# Patient Record
Sex: Male | Born: 1945 | Race: White | Hispanic: No | Marital: Married | Smoking: Former smoker
Health system: Southern US, Community
[De-identification: ages and names within clinical notes are randomized; demographics above are authoritative.]

---

## 2015-05-12 DIAGNOSIS — K760 Fatty (change of) liver, not elsewhere classified: Secondary | ICD-10-CM | POA: Diagnosis present

## 2020-11-04 ENCOUNTER — Emergency Department: Payer: 59

## 2020-11-04 ENCOUNTER — Other Ambulatory Visit: Payer: Self-pay

## 2020-11-04 ENCOUNTER — Inpatient Hospital Stay
Admission: EM | Admit: 2020-11-04 | Discharge: 2020-11-09 | DRG: 177 | Disposition: A | Discharge status: Home / Self Care | Payer: 59 | Attending: Internal Medicine | Admitting: Internal Medicine

## 2020-11-04 DIAGNOSIS — K449 Diaphragmatic hernia without obstruction or gangrene: Secondary | ICD-10-CM | POA: Diagnosis present

## 2020-11-04 DIAGNOSIS — J9600 Acute respiratory failure, unspecified whether with hypoxia or hypercapnia: Secondary | ICD-10-CM

## 2020-11-04 DIAGNOSIS — K5900 Constipation, unspecified: Secondary | ICD-10-CM | POA: Diagnosis not present

## 2020-11-04 DIAGNOSIS — J9601 Acute respiratory failure with hypoxia: Secondary | ICD-10-CM | POA: Diagnosis present

## 2020-11-04 DIAGNOSIS — Z79899 Other long term (current) drug therapy: Secondary | ICD-10-CM

## 2020-11-04 DIAGNOSIS — Z6830 Body mass index (BMI) 30.0-30.9, adult: Secondary | ICD-10-CM | POA: Diagnosis not present

## 2020-11-04 DIAGNOSIS — E860 Dehydration: Secondary | ICD-10-CM | POA: Diagnosis present

## 2020-11-04 DIAGNOSIS — J1282 Pneumonia due to coronavirus disease 2019: Secondary | ICD-10-CM | POA: Diagnosis present

## 2020-11-04 DIAGNOSIS — U071 COVID-19: Principal | ICD-10-CM | POA: Diagnosis present

## 2020-11-04 DIAGNOSIS — Z87891 Personal history of nicotine dependence: Secondary | ICD-10-CM | POA: Diagnosis not present

## 2020-11-04 DIAGNOSIS — E785 Hyperlipidemia, unspecified: Secondary | ICD-10-CM | POA: Diagnosis present

## 2020-11-04 DIAGNOSIS — E669 Obesity, unspecified: Secondary | ICD-10-CM | POA: Diagnosis present

## 2020-11-04 DIAGNOSIS — K760 Fatty (change of) liver, not elsewhere classified: Secondary | ICD-10-CM | POA: Diagnosis present

## 2020-11-04 DIAGNOSIS — R0602 Shortness of breath: Secondary | ICD-10-CM | POA: Diagnosis present

## 2020-11-04 DIAGNOSIS — N179 Acute kidney failure, unspecified: Secondary | ICD-10-CM | POA: Diagnosis present

## 2020-11-04 LAB — BASIC METABOLIC PANEL
Anion gap: 11 (ref 5–15)
BUN: 17 mg/dL (ref 8–23)
CO2: 27 mmol/L (ref 22–32)
Calcium: 8.1 mg/dL — ABNORMAL LOW (ref 8.9–10.3)
Chloride: 101 mmol/L (ref 98–111)
Creatinine, Ser: 1.26 mg/dL — ABNORMAL HIGH (ref 0.61–1.24)
GFR, Estimated: >60 mL/min (ref >60)
Glucose, Bld: 113 mg/dL — ABNORMAL HIGH (ref 70–99)
Potassium: 3.9 mmol/L (ref 3.5–5.1)
Sodium: 139 mmol/L (ref 135–145)

## 2020-11-04 LAB — CBC
HCT: 48.7 % (ref 39.0–52.0)
Hemoglobin: 16.1 g/dL (ref 13.0–17.0)
MCH: 30.7 pg (ref 26.0–34.0)
MCHC: 33.1 g/dL (ref 30.0–36.0)
MCV: 92.9 fL (ref 80.0–100.0)
Platelets: 108 10*3/uL — ABNORMAL LOW (ref 150–400)
RBC: 5.24 MIL/uL (ref 4.22–5.81)
RDW: 12.6 % (ref 11.5–15.5)
WBC: 3.8 10*3/uL — ABNORMAL LOW (ref 4.0–10.5)
nRBC: 0 % (ref 0.0–0.2)

## 2020-11-04 LAB — FIBRIN DERIVATIVES D-DIMER (ARMC ONLY): Fibrin derivatives D-dimer (ARMC): 1698.71 ng/mL (FEU) — ABNORMAL HIGH (ref 0.00–499.00)

## 2020-11-04 MED ORDER — ACETAMINOPHEN 325 MG PO TABS: 650.0000 mg | ORAL_TABLET | Freq: Four times a day (QID) | ORAL | Status: DC | PRN

## 2020-11-04 MED ORDER — ZINC SULFATE 220 (50 ZN) MG PO CAPS
220.0000 mg | ORAL_CAPSULE | Freq: Every day | ORAL | Status: DC
  Administered 2020-11-05 – 2020-11-09 (×5): 220 mg via ORAL
  Filled 2020-11-04 (×5): qty 1

## 2020-11-04 MED ORDER — ONDANSETRON HCL 4 MG/2ML IJ SOLN: 4.0000 mg | Freq: Four times a day (QID) | INTRAMUSCULAR | Status: DC | PRN

## 2020-11-04 MED ORDER — SODIUM CHLORIDE 0.9 % IV SOLN
100.0000 mg | Freq: Every day | INTRAVENOUS | Status: AC
  Administered 2020-11-05 – 2020-11-08 (×4): 100 mg via INTRAVENOUS
  Filled 2020-11-04 (×4): qty 20

## 2020-11-04 MED ORDER — SODIUM CHLORIDE 0.9 % IV SOLN
200.0000 mg | Freq: Once | INTRAVENOUS | Status: AC
  Administered 2020-11-04: 20:00:00 200 mg via INTRAVENOUS
  Filled 2020-11-04: qty 200

## 2020-11-04 MED ORDER — ASCORBIC ACID 500 MG PO TABS
500.0000 mg | ORAL_TABLET | Freq: Every day | ORAL | Status: DC
  Administered 2020-11-05 – 2020-11-09 (×5): 500 mg via ORAL
  Filled 2020-11-04 (×5): qty 1

## 2020-11-04 MED ORDER — ENOXAPARIN SODIUM 60 MG/0.6ML ~~LOC~~ SOLN
60.0000 mg | SUBCUTANEOUS | Status: DC
  Administered 2020-11-05 – 2020-11-07 (×3): 60 mg via SUBCUTANEOUS
  Filled 2020-11-04 (×6): qty 0.6

## 2020-11-04 MED ORDER — DEXAMETHASONE SODIUM PHOSPHATE 10 MG/ML IJ SOLN
10.0000 mg | Freq: Once | INTRAMUSCULAR | Status: AC
  Administered 2020-11-04: 19:00:00 10 mg via INTRAVENOUS
  Filled 2020-11-04: qty 1

## 2020-11-04 MED ORDER — DEXAMETHASONE SODIUM PHOSPHATE 10 MG/ML IJ SOLN
6.0000 mg | INTRAMUSCULAR | Status: DC
Start: 2020-11-04 — End: 2020-11-08
  Administered 2020-11-05 – 2020-11-07 (×3): 6 mg via INTRAVENOUS
  Filled 2020-11-04 (×4): qty 1

## 2020-11-04 MED ORDER — ACETAMINOPHEN 325 MG PO TABS
ORAL_TABLET | ORAL | Status: AC
  Administered 2020-11-04: 20:00:00 650 mg via ORAL
  Filled 2020-11-04: qty 2

## 2020-11-04 MED ORDER — HYDROCOD POLST-CPM POLST ER 10-8 MG/5ML PO SUER
5.0000 mL | Freq: Two times a day (BID) | ORAL | Status: DC | PRN
  Administered 2020-11-05 – 2020-11-08 (×3): 5 mL via ORAL
  Filled 2020-11-04 (×3): qty 5

## 2020-11-04 MED ORDER — SODIUM CHLORIDE 0.9 % IV SOLN: 200.0000 mg | Freq: Once | INTRAVENOUS | Status: DC

## 2020-11-04 MED ORDER — GUAIFENESIN-DM 100-10 MG/5ML PO SYRP
10.0000 mL | ORAL_SOLUTION | ORAL | Status: DC | PRN
  Administered 2020-11-07 – 2020-11-08 (×2): 10 mL via ORAL
  Filled 2020-11-04 (×2): qty 10

## 2020-11-04 MED ORDER — ALBUTEROL SULFATE HFA 108 (90 BASE) MCG/ACT IN AERS
2.0000 | INHALATION_SPRAY | Freq: Four times a day (QID) | RESPIRATORY_TRACT | Status: DC
  Administered 2020-11-05 – 2020-11-09 (×17): 2 via RESPIRATORY_TRACT
  Filled 2020-11-04: qty 6.7

## 2020-11-04 MED ORDER — ADULT MULTIVITAMIN W/MINERALS CH
1.0000 | ORAL_TABLET | Freq: Every day | ORAL | Status: DC
  Administered 2020-11-05 – 2020-11-09 (×5): 1 via ORAL
  Filled 2020-11-04 (×5): qty 1

## 2020-11-04 MED ORDER — SODIUM CHLORIDE 0.9 % IV SOLN: 100.0000 mg | Freq: Every day | INTRAVENOUS | Status: DC

## 2020-11-04 MED ORDER — ONDANSETRON HCL 4 MG PO TABS: 4.0000 mg | ORAL_TABLET | Freq: Four times a day (QID) | ORAL | Status: DC | PRN

## 2020-11-04 NOTE — ED Provider Notes (Signed)
[facility] Emergency Department Provider Note  ____________________________________________   I have reviewed the triage vital signs and the nursing notes.   HISTORY  Chief Complaint Shortness of Breath   History limited by: Not Limited   HPI Johnny Reed is a 74 y.o. male who presents to the emergency department today because of concerns for continued symptoms after diagnosis of Covid.  Patient states that roughly 1 week ago he was diagnosed with Covid.  He was having symptoms of headache and congestion.  States the symptoms have somewhat improved although he is continued to have shortness of breath and weakness.  The patient states that he does not have any history of lung disease.    Records reviewed. Per medical record review patient has a history of positive covid test on 12/18  History reviewed. No pertinent past medical history.  There are no problems to display for this patient.   History reviewed. No pertinent surgical history.  Prior to Admission medications   Not on File    Allergies Patient has no known allergies.  History reviewed. No pertinent family history.  Social History Social History   Tobacco Use  . Smoking status: Former Games developer  . Smokeless tobacco: Never Used    Review of Systems Constitutional: Positive for fever. Eyes: No visual changes. ENT: Positive for sore throat.  Cardiovascular: Denies chest pain. Respiratory: Positive for shortness of breath. Gastrointestinal: No abdominal pain. Genitourinary: Negative for dysuria. Musculoskeletal: Positive for bodyaches. Skin: Negative for rash. Neurological: Positive for headache. ____________________________________________   PHYSICAL EXAM:  VITAL SIGNS: ED Triage Vitals [11/04/20 1801]  Enc Vitals Group     BP (!) 144/109     Pulse Rate 96     Resp 14     Temp (!) 101.4 F (38.6 C)     Temp Source Oral     SpO2 (!) 87 %     Weight      Height      Head  Circumference      Peak Flow      Pain Score 0   Constitutional: Alert and oriented.  Eyes: Conjunctivae are normal.  ENT      Head: Normocephalic and atraumatic.      Nose: No congestion/rhinnorhea.      Mouth/Throat: Mucous membranes are moist.      Neck: No stridor. Cardiovascular: Normal rate, regular rhythm.  Respiratory: Normal respiratory effort without tachypnea nor retractions.  Gastrointestinal: Soft and non tender. No rebound. No guarding.  Genitourinary: Deferred Musculoskeletal: Normal range of motion in all extremities.  Neurologic:  Normal speech and language. No gross focal neurologic deficits are appreciated.  Skin:  Skin is warm, dry and intact. No rash noted. Psychiatric: Mood and affect are normal. Speech and behavior are normal. Patient exhibits appropriate insight and judgment.  ____________________________________________    LABS (pertinent positives/negatives)  CBC wbc 3.8, hgb 16.1, plt 108 BMP wnl except glu 113, cr 1.26, ca 8.1  ____________________________________________   EKG  None  ____________________________________________    RADIOLOGY  CXR Large opacity over bilateral lower lungs, secondary to possible hiatal hernia, pneumonia not ruled out.  ____________________________________________   PROCEDURES  Procedures  CRITICAL CARE Performed by: Phineas Semen   Total critical care time: 30 minutes  Critical care time was exclusive of separately billable procedures and treating other patients.  Critical care was necessary to treat or prevent imminent or life-threatening deterioration.  Critical care was time spent personally by me on the following  activities: development of treatment plan with patient and/or surrogate as well as nursing, discussions with consultants, evaluation of patient's response to treatment, examination of patient, obtaining history from patient or surrogate, ordering and performing treatments and  interventions, ordering and review of laboratory studies, ordering and review of radiographic studies, pulse oximetry and re-evaluation of patient's condition.  ____________________________________________   INITIAL IMPRESSION / ASSESSMENT AND PLAN / ED COURSE  Pertinent labs & imaging results that were available during my care of the patient were reviewed by me and considered in my medical decision making (see chart for details).   Patient presented to the emergency department today with concerns for continued symptoms thought related to recent Covid diagnosis.  Patient was found to be hypoxic here in the emergency department.  Checks x-ray shows may be pneumonia in the lower lungs although it is hard to assess given the hiatal hernia and possible atelectasis.  Patient denies any history of lung disease.  Patient was placed on oxygen.  Will give patient steroids and remdesivir.  Will plan on admission to the hospital service.  Discussed findings and plan with patient. ____________________________________________   FINAL CLINICAL IMPRESSION(S) / ED DIAGNOSES  Final diagnoses:  COVID-19     Note: This dictation was prepared with Dragon dictation. Any transcriptional errors that result from this process are unintentional     Phineas Semen, MD 11/04/20 2010

## 2020-11-04 NOTE — ED Triage Notes (Signed)
Pt presents via POV c/o sore throat and increased drowsiness. + Covid per pt report. Reports headache has resolved but still very drowsy.

## 2020-11-04 NOTE — ED Notes (Signed)
Dr Derrill Kay sent secure chat of pt current temp 102.59F oral..  Pt sitting up in bed awake and alert; GCS 14.  Pt stated he was in [place].  Denies active pain and denies sob.  RR even and unlabored on 3L O2 via [place].  Cardia monitor maintained - NSR 88.  Abdomen soft nontender. Skin warm dry and intact.  Will  Monitor for acute changes and maintain plan of care,

## 2020-11-04 NOTE — Progress Notes (Signed)
Remdesivir - Pharmacy Brief Note   O:  ALT:  CXR:  SpO2: 94 % on RA    A/P:  Remdesivir 200 mg IVPB once followed by 100 mg IVPB daily x 4 days.   Johnny Reed D 11/04/2020 6:23 PM

## 2020-11-04 NOTE — H&P (Signed)
History and Physical    Johnny Reed IOX:[id number] DOB: [birth year] DOA: 11/04/2020  PCP: Patient, No Pcp Per   Patient coming from: Home  I have personally briefly reviewed patient's old medical records in [facility]  Chief Complaint: Shortness of breath, generalized weakness and malaise, Covid positive since 12/18  HPI: Johnny Reed is a 74 y.o. male with medical history significant for HLD and nonalcoholic fatty liver disease, diagnosed at the urgent care with Covid on 12/18 who presents to the emergency room with persistent malaise, fever, and shortness of breath and cough. Also had a single episode of diarrhea prior to coming in. He initially presented to the urgent care with upper respiratory symptoms of nasal congestion and sore throat, when testing returned positive for Covid.  He denies chest pain, nausea vomiting or diarrhea.  Has no lower extremity pain or swelling. ED Course: On arrival, he was febrile at 102.2 heart rate 89 respirations 21 with O2 sat 87% on room air.  BP 128/66.  Blood work with mild leukopenia of 3800, and creatinine of 1.26, which appears to be his baseline from a year ago on Care Everywhere. Imaging: Chest x-ray showed Large hiatal hernia and associated atelectasis. Pneumonia is not excluded.   Review of Systems: As per HPI otherwise all other systems on review of systems negative.    History reviewed. No pertinent past medical history.  History reviewed. No pertinent surgical history.   reports that he has quit smoking. He has never used smokeless tobacco. No history on file for alcohol use and drug use.  No Known Allergies  History reviewed. No pertinent family history.    Prior to Admission medications   Medication Sig Start Date End Date Taking? Authorizing Provider  fluticasone (FLONASE) 50 MCG/ACT nasal spray Place 1 spray into both nostrils daily.   Yes [provider]    Physical Exam: Vitals:   11/04/20 1839 11/04/20  1845 11/04/20 1851 11/04/20 1956  BP:   (!) 179/71 128/66  Pulse:   71 96  Resp: 15 13 14  (!) 21  Temp:    (!) 102.2 F (39 C)  TempSrc:    Oral  SpO2:   100% 94%     Vitals:   11/04/20 1839 11/04/20 1845 11/04/20 1851 11/04/20 1956  BP:   (!) 179/71 128/66  Pulse:   71 96  Resp: 15 13 14  (!) 21  Temp:    (!) 102.2 F (39 C)  TempSrc:    Oral  SpO2:   100% 94%      Constitutional: Alert and oriented x 3 . Conversational dyspnea HEENT:      Head: Normocephalic and atraumatic.         Eyes: PERLA, EOMI, Conjunctivae are normal. Sclera is non-icteric.       Mouth/Throat: Mucous membranes are moist.       Neck: Supple with no signs of meningismus. Cardiovascular: Regular rate and rhythm. No murmurs, gallops, or rubs. 2+ symmetrical distal pulses are present . No JVD. No LE edema Respiratory: Respiratory effort increased, with increased work of breathing. Coarse breath sounds.  Gastrointestinal: Soft, non tender, and non distended with positive bowel sounds.  Genitourinary: No CVA tenderness. Musculoskeletal: Nontender with normal range of motion in all extremities. No cyanosis, or erythema of extremities. Neurologic:  Face is symmetric. Moving all extremities. No gross focal neurologic deficits . Skin: Skin is warm, dry.  No rash or ulcers Psychiatric: Mood and affect are normal  Labs on Admission: I have personally reviewed following labs and imaging studies  CBC: Recent Labs  Lab 11/04/20 1838  WBC 3.8*  HGB 16.1  HCT 48.7  MCV 92.9  PLT 108*   Basic Metabolic Panel: Recent Labs  Lab 11/04/20 1838  NA 139  K 3.9  CL 101  CO2 27  GLUCOSE 113*  BUN 17  CREATININE 1.26*  CALCIUM 8.1*   GFR: CrCl cannot be calculated (Unknown ideal weight.). Liver Function Tests: No results for input(s): AST, ALT, ALKPHOS, BILITOT, PROT, ALBUMIN in the last 168 hours. No results for input(s): LIPASE, AMYLASE in the last 168 hours. No results for input(s): AMMONIA in  the last 168 hours. Coagulation Profile: No results for input(s): INR, PROTIME in the last 168 hours. Cardiac Enzymes: No results for input(s): CKTOTAL, CKMB, CKMBINDEX, TROPONINI in the last 168 hours. BNP (last 3 results) No results for input(s): PROBNP in the last 8760 hours. HbA1C: No results for input(s): HGBA1C in the last 72 hours. CBG: No results for input(s): GLUCAP in the last 168 hours. Lipid Profile: No results for input(s): CHOL, HDL, LDLCALC, TRIG, CHOLHDL, LDLDIRECT in the last 72 hours. Thyroid Function Tests: No results for input(s): TSH, T4TOTAL, FREET4, T3FREE, THYROIDAB in the last 72 hours. Anemia Panel: No results for input(s): VITAMINB12, FOLATE, FERRITIN, TIBC, IRON, RETICCTPCT in the last 72 hours. Urine analysis: No results found for: COLORURINE, APPEARANCEUR, LABSPEC, PHURINE, GLUCOSEU, HGBUR, BILIRUBINUR, KETONESUR, PROTEINUR, UROBILINOGEN, NITRITE, LEUKOCYTESUR  Radiological Exams on Admission: DG Chest Portable 1 View  Result Date: 11/04/2020 CLINICAL DATA:  74 year old male with positive COVID-19. Hypoxia. EXAM: PORTABLE CHEST 1 VIEW COMPARISON:  None. FINDINGS: Large area of opacity over lower lungs primarily left lung base likely combination of large hiatal hernia and associated atelectasis. Pneumonia is not excluded. No pleural effusion pneumothorax. Atherosclerotic calcification of the aorta. No acute osseous pathology. IMPRESSION: Large hiatal hernia and associated atelectasis. Pneumonia is not excluded. Electronically Signed   By: Elgie Collard M.D.   On: 11/04/2020 18:30     Assessment/Plan 74 year old male with history of HLD and nonalcoholic fatty liver disease, Covid positive on 12/18 presenting with persistent malaise, fever, and shortness of breath and cough.  O2 sat 87% on room air    Acute respiratory failure due to COVID-19 [facility]) -Covid positive since 12/18, now with persistent symptoms.  Febrile and tachypneic with O2 sat 87% on room  air, requiring 3 L O2, as well as increased work of breathing and speaking in 3 word sentences. -Chest x-ray  " pneumonia is not excluded".  Opacity seen over left lower lobe possible hiatal hernia -Remdesivir, albuterol, steroids, antitussives, and vitamins -Supplemental oxygen to keep sats over 92% and proning as tolerated -Follow inflammatory biomarkers -Given no definite pneumonia on chest x-ray will get D-dimer (D-dimer elevated, CTA chest ordered to rule out PE)    Hyperlipidemia -Continue home meds pending med rec    Nonalcoholic fatty liver disease -No acute issues suspected    DVT prophylaxis: Lovenox  Code Status: full code  Family Communication:  none  Disposition Plan: Back to previous home environment Consults called: none  Status:At the time of admission, it appears that the appropriate admission status for this patient is INPATIENT. This is judged to be reasonable and necessary in order to provide the required intensity of service to ensure the patient's safety given the presenting symptoms, physical exam findings, and initial radiographic and laboratory data in the context of their  Comorbid conditions.  Patient requires inpatient status due to high intensity of service, high risk for further deterioration and high frequency of surveillance required.   I certify that at the point of admission it is my clinical judgment that the patient will require inpatient hospital care spanning beyond 2 midnights     Andris Baumann MD [facility]     11/04/2020, 7:59 PM

## 2020-11-04 NOTE — Progress Notes (Signed)
PHARMACIST - PHYSICIAN COMMUNICATION  CONCERNING:  Enoxaparin (Lovenox) for DVT Prophylaxis    RECOMMENDATION: Patient was prescribed enoxaprin 40mg  q24 hours for VTE prophylaxis.   Filed Weights   11/04/20 2240  Weight: 117 kg (258 lb)    Body mass index is 30.59 kg/m.  Estimated Creatinine Clearance: 75.2 mL/min (A) (by C-G formula based on SCr of 1.26 mg/dL (H)).   Based on [facility] policy patient is candidate for enoxaparin 0.5mg /kg TBW SQ every 24 hours based on BMI being >30.  DESCRIPTION: Pharmacy has adjusted enoxaparin dose per [facility] policy.  Patient is now receiving enoxaparin 60 mg every 24 hours    [facility], PharmD Clinical Pharmacist  11/04/2020 11:43 PM

## 2020-11-05 ENCOUNTER — Inpatient Hospital Stay: Payer: 59

## 2020-11-05 DIAGNOSIS — J9600 Acute respiratory failure, unspecified whether with hypoxia or hypercapnia: Secondary | ICD-10-CM | POA: Diagnosis not present

## 2020-11-05 DIAGNOSIS — U071 COVID-19: Secondary | ICD-10-CM | POA: Diagnosis not present

## 2020-11-05 LAB — GLUCOSE, CAPILLARY
Glucose-Capillary: 134 mg/dL — ABNORMAL HIGH (ref 70–99)
Glucose-Capillary: 119 mg/dL — ABNORMAL HIGH (ref 70–99)
Glucose-Capillary: 112 mg/dL — ABNORMAL HIGH (ref 70–99)
Glucose-Capillary: 130 mg/dL — ABNORMAL HIGH (ref 70–99)

## 2020-11-05 LAB — C-REACTIVE PROTEIN: CRP: 1.6 mg/dL — ABNORMAL HIGH (ref <1.0)

## 2020-11-05 LAB — COMPREHENSIVE METABOLIC PANEL
ALT: 42 U/L (ref 0–44)
AST: 82 U/L — ABNORMAL HIGH (ref 15–41)
Albumin: 3.3 g/dL — ABNORMAL LOW (ref 3.5–5.0)
Alkaline Phosphatase: 55 U/L (ref 38–126)
Anion gap: 12 (ref 5–15)
BUN: 23 mg/dL (ref 8–23)
CO2: 30 mmol/L (ref 22–32)
Calcium: 8.7 mg/dL — ABNORMAL LOW (ref 8.9–10.3)
Chloride: 100 mmol/L (ref 98–111)
Creatinine, Ser: 1.56 mg/dL — ABNORMAL HIGH (ref 0.61–1.24)
GFR, Estimated: 47 mL/min — ABNORMAL LOW (ref >60)
Glucose, Bld: 177 mg/dL — ABNORMAL HIGH (ref 70–99)
Potassium: 4.4 mmol/L (ref 3.5–5.1)
Sodium: 142 mmol/L (ref 135–145)
Total Bilirubin: 1.1 mg/dL (ref 0.3–1.2)
Total Protein: 7.1 g/dL (ref 6.5–8.1)

## 2020-11-05 LAB — CBC WITH DIFFERENTIAL/PLATELET
Abs Immature Granulocytes: 0.02 10*3/uL (ref 0.00–0.07)
Basophils Absolute: 0 10*3/uL (ref 0.0–0.1)
Basophils Relative: 0 %
Eosinophils Absolute: 0 10*3/uL (ref 0.0–0.5)
Eosinophils Relative: 0 %
HCT: 51.8 % (ref 39.0–52.0)
Hemoglobin: 16.8 g/dL (ref 13.0–17.0)
Immature Granulocytes: 1 %
Lymphocytes Relative: 21 %
Lymphs Abs: 0.6 10*3/uL — ABNORMAL LOW (ref 0.7–4.0)
MCH: 30.5 pg (ref 26.0–34.0)
MCHC: 32.4 g/dL (ref 30.0–36.0)
MCV: 94.2 fL (ref 80.0–100.0)
Monocytes Absolute: 0.3 10*3/uL (ref 0.1–1.0)
Monocytes Relative: 10 %
Neutro Abs: 2 10*3/uL (ref 1.7–7.7)
Neutrophils Relative %: 68 %
Platelets: 113 10*3/uL — ABNORMAL LOW (ref 150–400)
RBC: 5.5 MIL/uL (ref 4.22–5.81)
RDW: 12.6 % (ref 11.5–15.5)
WBC: 2.9 10*3/uL — ABNORMAL LOW (ref 4.0–10.5)
nRBC: 0 % (ref 0.0–0.2)

## 2020-11-05 LAB — FIBRIN DERIVATIVES D-DIMER (ARMC ONLY): Fibrin derivatives D-dimer (ARMC): 1588.03 ng/mL (FEU) — ABNORMAL HIGH (ref 0.00–499.00)

## 2020-11-05 MED ORDER — IOHEXOL 350 MG/ML SOLN: 75.0000 mL | Freq: Once | INTRAVENOUS | Status: DC | PRN

## 2020-11-05 MED ORDER — IOHEXOL 350 MG/ML SOLN
100.0000 mL | Freq: Once | INTRAVENOUS | Status: AC | PRN
  Administered 2020-11-05: 11:00:00 100 mL via INTRAVENOUS

## 2020-11-05 MED ORDER — LACTATED RINGERS IV SOLN: INTRAVENOUS | Status: DC

## 2020-11-05 NOTE — Progress Notes (Addendum)
Progress Note    Johnny Reed  [id number] DOB: 1946-08-14  DOA: 11/04/2020 PCP: Patient, No Pcp Per      Brief Narrative:    Medical records reviewed and are as summarized below:  Johnny Reed is a 74 y.o. male       Assessment/Plan:   Principal Problem:   Acute respiratory failure due to COVID-19 [facility]) Active Problems:   Hyperlipidemia   Nonalcoholic fatty liver disease    Body mass index is 30.59 kg/m. ( Obesity)   COVID-19 pneumonia: Continue IV remdesivir and IV dexamethasone.  Use incentive spirometry as needed.  No evidence of pulmonary embolism on CTA of the chest.  Acute hypoxic respiratory failure: He is on 4 L/min oxygen by nasal cannula.  Taper down oxygen as able.  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  Monitor BMP closely.  Large hiatal hernia noted on CT chest: Asymptomatic.  No evidence of bowel obstruction.   Diet Order            Diet regular Room service appropriate? Yes; Fluid consistency: Thin  Diet effective now                    Consultants:  None  Procedures:  None    Medications:   . albuterol  2 puff Inhalation Q6H  . vitamin C  500 mg Oral Daily  . dexamethasone (DECADRON) injection  6 mg Intravenous Q24H  . enoxaparin (LOVENOX) injection  60 mg Subcutaneous Q24H  . multivitamin with minerals  1 tablet Oral Daily  . zinc sulfate  220 mg Oral Daily   Continuous Infusions: . remdesivir 100 mg in NS 100 mL 100 mg (11/05/20 0941)     Anti-infectives (From admission, onward)   Start     Dose/Rate Route Frequency Ordered Stop   11/05/20 1000  remdesivir 100 mg in sodium chloride 0.9 % 100 mL IVPB       "Followed by" Linked Group Details   100 mg 200 mL/hr over 30 Minutes Intravenous Daily 11/04/20 1819 11/09/20 0959   11/05/20 1000  remdesivir 100 mg in sodium chloride 0.9 % 100 mL IVPB  Status:  Discontinued       "Followed by" Linked Group Details   100 mg 200 mL/hr over 30 Minutes  Intravenous Daily 11/04/20 1959 11/04/20 2038   11/04/20 2000  remdesivir 200 mg in sodium chloride 0.9% 250 mL IVPB  Status:  Discontinued       "Followed by" Linked Group Details   200 mg 580 mL/hr over 30 Minutes Intravenous Once 11/04/20 1959 11/04/20 2038   11/04/20 1830  remdesivir 200 mg in sodium chloride 0.9% 250 mL IVPB       "Followed by" Linked Group Details   200 mg 580 mL/hr over 30 Minutes Intravenous Once 11/04/20 1819 11/04/20 2059             Family Communication/Anticipated D/C date and plan/Code Status   DVT prophylaxis:      Code Status: Full Code  Family Communication: None Disposition Plan:    Status is: Inpatient  Remains inpatient appropriate because:IV treatments appropriate due to intensity of illness or inability to take PO   Dispo: The patient is from: Home              Anticipated d/c is to: Home              Anticipated d/c date is: > 3 days  Patient currently is not medically stable to d/c.           Subjective:   C/o shortness of breath with exertion  Objective:    Vitals:   11/04/20 2240 11/05/20 0044 11/05/20 0515 11/05/20 0849  BP: 110/72 106/78 114/84 117/81  Pulse: 71 70 73 70  Resp: 17 15 15 16   Temp: 98.4 F (36.9 C) (!) 97.5 F (36.4 C) (!) 97.5 F (36.4 C) 98 F (36.7 C)  TempSrc: Oral Oral Oral   SpO2: 96% 94% 95% 95%  Weight: 117 kg     Height: 6\' 5"  (1.956 m)      No data found.   Intake/Output Summary (Last 24 hours) at 11/05/2020 1057 Last data filed at 11/05/2020 0300 Gross per 24 hour  Intake --  Output 2 ml  Net -2 ml   Filed Weights   11/04/20 2240  Weight: 117 kg    Exam:  GEN: NAD SKIN: No rash EYES: EOMI ENT: MMM CV: RRR PULM: CTA B ABD: soft, ND, NT, +BS CNS: AAO x 3, non focal EXT: No edema or tenderness   Data Reviewed:   I have personally reviewed following labs and imaging studies:  Labs: Labs show the following:   Basic Metabolic  Panel: Recent Labs  Lab 11/04/20 1838 11/05/20 0427  NA 139 142  K 3.9 4.4  CL 101 100  CO2 27 30  GLUCOSE 113* 177*  BUN 17 23  CREATININE 1.26* 1.56*  CALCIUM 8.1* 8.7*   GFR Estimated Creatinine Clearance: 60.7 mL/min (A) (by C-G formula based on SCr of 1.56 mg/dL (H)). Liver Function Tests: Recent Labs  Lab 11/05/20 0427  AST 82*  ALT 42  ALKPHOS 55  BILITOT 1.1  PROT 7.1  ALBUMIN 3.3*   No results for input(s): LIPASE, AMYLASE in the last 168 hours. No results for input(s): AMMONIA in the last 168 hours. Coagulation profile No results for input(s): INR, PROTIME in the last 168 hours.  CBC: Recent Labs  Lab 11/04/20 1838 11/05/20 0427  WBC 3.8* 2.9*  NEUTROABS  --  2.0  HGB 16.1 16.8  HCT 48.7 51.8  MCV 92.9 94.2  PLT 108* 113*   Cardiac Enzymes: No results for input(s): CKTOTAL, CKMB, CKMBINDEX, TROPONINI in the last 168 hours. BNP (last 3 results) No results for input(s): PROBNP in the last 8760 hours. CBG: Recent Labs  Lab 11/05/20 0848  GLUCAP 134*   D-Dimer: No results for input(s): DDIMER in the last 72 hours. Hgb A1c: No results for input(s): HGBA1C in the last 72 hours. Lipid Profile: No results for input(s): CHOL, HDL, LDLCALC, TRIG, CHOLHDL, LDLDIRECT in the last 72 hours. Thyroid function studies: No results for input(s): TSH, T4TOTAL, T3FREE, THYROIDAB in the last 72 hours.  Invalid input(s): FREET3 Anemia work up: No results for input(s): VITAMINB12, FOLATE, FERRITIN, TIBC, IRON, RETICCTPCT in the last 72 hours. Sepsis Labs: Recent Labs  Lab 11/04/20 1838 11/05/20 0427  WBC 3.8* 2.9*    Microbiology No results found for this or any previous visit (from the past 240 hour(s)).  Procedures and diagnostic studies:  DG Chest Portable 1 View  Result Date: 11/04/2020 CLINICAL DATA:  74 year old male with positive COVID-19. Hypoxia. EXAM: PORTABLE CHEST 1 VIEW COMPARISON:  None. FINDINGS: Large area of opacity over lower lungs  primarily left lung base likely combination of large hiatal hernia and associated atelectasis. Pneumonia is not excluded. No pleural effusion pneumothorax. Atherosclerotic calcification of the aorta. No acute osseous  pathology. IMPRESSION: Large hiatal hernia and associated atelectasis. Pneumonia is not excluded. Electronically Signed   By: Elgie Collard M.D.   On: 11/04/2020 18:30               LOS: 1 day   Qais Jowers  [facility]   Pager on [email or web address]. If 7PM-7AM, please contact night-coverage at [email or web address]     11/05/2020, 10:57 AM

## 2020-11-06 DIAGNOSIS — K760 Fatty (change of) liver, not elsewhere classified: Secondary | ICD-10-CM | POA: Diagnosis not present

## 2020-11-06 DIAGNOSIS — U071 COVID-19: Secondary | ICD-10-CM | POA: Diagnosis not present

## 2020-11-06 DIAGNOSIS — J9600 Acute respiratory failure, unspecified whether with hypoxia or hypercapnia: Secondary | ICD-10-CM | POA: Diagnosis not present

## 2020-11-06 LAB — CBC WITH DIFFERENTIAL/PLATELET
Abs Immature Granulocytes: 0.02 10*3/uL (ref 0.00–0.07)
Basophils Absolute: 0 10*3/uL (ref 0.0–0.1)
Basophils Relative: 0 %
Eosinophils Absolute: 0 10*3/uL (ref 0.0–0.5)
Eosinophils Relative: 0 %
HCT: 48 % (ref 39.0–52.0)
Hemoglobin: 16 g/dL (ref 13.0–17.0)
Immature Granulocytes: 1 %
Lymphocytes Relative: 17 %
Lymphs Abs: 0.7 10*3/uL (ref 0.7–4.0)
MCH: 31.3 pg (ref 26.0–34.0)
MCHC: 33.3 g/dL (ref 30.0–36.0)
MCV: 93.8 fL (ref 80.0–100.0)
Monocytes Absolute: 0.5 10*3/uL (ref 0.1–1.0)
Monocytes Relative: 11 %
Neutro Abs: 3.1 10*3/uL (ref 1.7–7.7)
Neutrophils Relative %: 71 %
Platelets: 127 10*3/uL — ABNORMAL LOW (ref 150–400)
RBC: 5.12 MIL/uL (ref 4.22–5.81)
RDW: 12.7 % (ref 11.5–15.5)
WBC: 4.3 10*3/uL (ref 4.0–10.5)
nRBC: 0 % (ref 0.0–0.2)

## 2020-11-06 LAB — COMPREHENSIVE METABOLIC PANEL
ALT: 36 U/L (ref 0–44)
AST: 62 U/L — ABNORMAL HIGH (ref 15–41)
Albumin: 2.9 g/dL — ABNORMAL LOW (ref 3.5–5.0)
Alkaline Phosphatase: 50 U/L (ref 38–126)
Anion gap: 9 (ref 5–15)
BUN: 28 mg/dL — ABNORMAL HIGH (ref 8–23)
CO2: 29 mmol/L (ref 22–32)
Calcium: 8.1 mg/dL — ABNORMAL LOW (ref 8.9–10.3)
Chloride: 101 mmol/L (ref 98–111)
Creatinine, Ser: 1.18 mg/dL (ref 0.61–1.24)
GFR, Estimated: >60 mL/min (ref >60)
Glucose, Bld: 196 mg/dL — ABNORMAL HIGH (ref 70–99)
Potassium: 4.2 mmol/L (ref 3.5–5.1)
Sodium: 139 mmol/L (ref 135–145)
Total Bilirubin: 0.9 mg/dL (ref 0.3–1.2)
Total Protein: 6.5 g/dL (ref 6.5–8.1)

## 2020-11-06 LAB — C-REACTIVE PROTEIN: CRP: 0.9 mg/dL (ref <1.0)

## 2020-11-06 LAB — FIBRIN DERIVATIVES D-DIMER (ARMC ONLY): Fibrin derivatives D-dimer (ARMC): 963.21 ng/mL (FEU) — ABNORMAL HIGH (ref 0.00–499.00)

## 2020-11-06 NOTE — Progress Notes (Signed)
Progress Note    Hawthorne Day  XBJ:[id number] DOB: [birth year]  DOA: 11/04/2020 PCP: Patient, No Pcp Per      Brief Narrative:    Medical records reviewed and are as summarized below:  Johnny Reed is a 74 y.o. male       Assessment/Plan:   Principal Problem:   Acute respiratory failure due to COVID-19 [facility]) Active Problems:   Hyperlipidemia   Nonalcoholic fatty liver disease    Body mass index is 30.59 kg/m. ( Obesity)   COVID-19 pneumonia: Continue IV remdesivir and IV dexamethasone.  Use incentive spirometer as needed.  No evidence of pulmonary embolism on CTA of the chest.    Acute hypoxic respiratory failure: He is still on 4 L/min oxygen via nasal cannula and oxygen saturation is 90 to 91% with occasional desaturation to 86%.  Taper down oxygen as able.  AKI: Creatinine has improved.  Discontinue IV fluids.  Monitor BMP.  Large hiatal hernia noted on CT chest: Asymptomatic.  No evidence of bowel obstruction.   Diet Order            Diet regular Room service appropriate? Yes; Fluid consistency: Thin  Diet effective now                    Consultants:  None  Procedures:  None    Medications:   . albuterol  2 puff Inhalation Q6H  . vitamin C  500 mg Oral Daily  . dexamethasone (DECADRON) injection  6 mg Intravenous Q24H  . enoxaparin (LOVENOX) injection  60 mg Subcutaneous Q24H  . multivitamin with minerals  1 tablet Oral Daily  . zinc sulfate  220 mg Oral Daily   Continuous Infusions: . remdesivir 100 mg in NS 100 mL 100 mg (11/06/20 0804)     Anti-infectives (From admission, onward)   Start     Dose/Rate Route Frequency Ordered Stop   11/05/20 1000  remdesivir 100 mg in sodium chloride 0.9 % 100 mL IVPB       "Followed by" Linked Group Details   100 mg 200 mL/hr over 30 Minutes Intravenous Daily 11/04/20 1819 11/09/20 0959   11/05/20 1000  remdesivir 100 mg in sodium chloride 0.9 % 100 mL IVPB  Status:  Discontinued        "Followed by" Linked Group Details   100 mg 200 mL/hr over 30 Minutes Intravenous Daily 11/04/20 1959 11/04/20 2038   11/04/20 2000  remdesivir 200 mg in sodium chloride 0.9% 250 mL IVPB  Status:  Discontinued       "Followed by" Linked Group Details   200 mg 580 mL/hr over 30 Minutes Intravenous Once 11/04/20 1959 11/04/20 2038   11/04/20 1830  remdesivir 200 mg in sodium chloride 0.9% 250 mL IVPB       "Followed by" Linked Group Details   200 mg 580 mL/hr over 30 Minutes Intravenous Once 11/04/20 1819 11/04/20 2059             Family Communication/Anticipated D/C date and plan/Code Status   DVT prophylaxis:      Code Status: Full Code  Family Communication: None Disposition Plan:    Status is: Inpatient  Remains inpatient appropriate because:IV treatments appropriate due to intensity of illness or inability to take PO   Dispo: The patient is from: Home              Anticipated d/c is to: Home  Anticipated d/c date is: > 3 days              Patient currently is not medically stable to d/c.           Subjective:   Interval events noted.  He is comfortable at rest but short of breath with exertion.  He has a cough.  No diarrhea.  Objective:    Vitals:   11/05/20 1700 11/05/20 1954 11/06/20 0435 11/06/20 0829  BP: 110/77 99/75 106/88 125/83  Pulse: 75 87 68 70  Resp: 16 19 16 16   Temp: 98.1 F (36.7 C) 97.9 F (36.6 C) 97.7 F (36.5 C) 98 F (36.7 C)  TempSrc:  Oral Oral   SpO2: 93% 94% 92% 94%  Weight:      Height:       No data found.   Intake/Output Summary (Last 24 hours) at 11/06/2020 1106 Last data filed at 11/06/2020 11/08/2020 Gross per 24 hour  Intake 1095.09 ml  Output 650 ml  Net 445.09 ml   Filed Weights   11/04/20 2240  Weight: 117 kg    Exam:  GEN: NAD SKIN: Warm and dry EYES: No pallor or icterus ENT: MMM CV: RRR PULM: Basilar rales.  No wheezing heard. ABD: soft, obese, NT, +BS CNS: AAO x 3, non  focal EXT: No edema or tenderness      Data Reviewed:   I have personally reviewed following labs and imaging studies:  Labs: Labs show the following:   Basic Metabolic Panel: Recent Labs  Lab 11/04/20 1838 11/05/20 0427 11/06/20 0421  NA 139 142 139  K 3.9 4.4 4.2  CL 101 100 101  CO2 27 30 29   GLUCOSE 113* 177* 196*  BUN 17 23 28*  CREATININE 1.26* 1.56* 1.18  CALCIUM 8.1* 8.7* 8.1*   GFR Estimated Creatinine Clearance: 80.3 mL/min (by C-G formula based on SCr of 1.18 mg/dL). Liver Function Tests: Recent Labs  Lab 11/05/20 0427 11/06/20 0421  AST 82* 62*  ALT 42 36  ALKPHOS 55 50  BILITOT 1.1 0.9  PROT 7.1 6.5  ALBUMIN 3.3* 2.9*   No results for input(s): LIPASE, AMYLASE in the last 168 hours. No results for input(s): AMMONIA in the last 168 hours. Coagulation profile No results for input(s): INR, PROTIME in the last 168 hours.  CBC: Recent Labs  Lab 11/04/20 1838 11/05/20 0427 11/06/20 0421  WBC 3.8* 2.9* 4.3  NEUTROABS  --  2.0 3.1  HGB 16.1 16.8 16.0  HCT 48.7 51.8 48.0  MCV 92.9 94.2 93.8  PLT 108* 113* 127*   Cardiac Enzymes: No results for input(s): CKTOTAL, CKMB, CKMBINDEX, TROPONINI in the last 168 hours. BNP (last 3 results) No results for input(s): PROBNP in the last 8760 hours. CBG: Recent Labs  Lab 11/05/20 0848 11/05/20 1135 11/05/20 1703 11/05/20 1954  GLUCAP 134* 119* 112* 130*   D-Dimer: No results for input(s): DDIMER in the last 72 hours. Hgb A1c: No results for input(s): HGBA1C in the last 72 hours. Lipid Profile: No results for input(s): CHOL, HDL, LDLCALC, TRIG, CHOLHDL, LDLDIRECT in the last 72 hours. Thyroid function studies: No results for input(s): TSH, T4TOTAL, T3FREE, THYROIDAB in the last 72 hours.  Invalid input(s): FREET3 Anemia work up: No results for input(s): VITAMINB12, FOLATE, FERRITIN, TIBC, IRON, RETICCTPCT in the last 72 hours. Sepsis Labs: Recent Labs  Lab 11/04/20 1838 11/05/20 0427  11/06/20 0421  WBC 3.8* 2.9* 4.3    Microbiology No results found for  this or any previous visit (from the past 240 hour(s)).  Procedures and diagnostic studies:  CT ANGIO CHEST PE W OR WO CONTRAST  Result Date: 11/05/2020 CLINICAL DATA:  PE suspected, COVID positive EXAM: CT ANGIOGRAPHY CHEST WITH CONTRAST TECHNIQUE: Multidetector CT imaging of the chest was performed using the standard protocol during bolus administration of intravenous contrast. Multiplanar CT image reconstructions and MIPs were obtained to evaluate the vascular anatomy. CONTRAST:  100 mL Omnipaque 350 iodinated contrast IV COMPARISON:  None. FINDINGS: Cardiovascular: Satisfactory opacification of the pulmonary arteries to the segmental level. No evidence of pulmonary embolism. Normal heart size. No pericardial effusion. Aortic atherosclerosis. Scattered coronary artery calcifications. Mediastinum/Nodes: No enlarged mediastinal, hilar, or axillary lymph nodes. There is a very large hiatal/paraesophageal hernia, containing the entirety of the stomach, the pancreas, the transverse colon, and numerous loops of small bowel (series 7, image 65). Thyroid gland, trachea, and esophagus demonstrate no significant findings. Lungs/Pleura: There is dense atelectasis and/or consolidation of almost the entirety of the left lower lobe as well as portions of the right lower lobe, compressive secondary to large hernia. There are scattered areas of ground-glass and heterogeneous airspace opacity throughout the remainder of the lungs. No pleural effusion or pneumothorax. Upper Abdomen: No acute abnormality. Musculoskeletal: No chest wall abnormality. No acute or significant osseous findings. Review of the MIP images confirms the above findings. IMPRESSION: 1. Negative examination for pulmonary embolism. 2. There is a very large hiatal/paraesophageal hernia, containing the entirety of the stomach, the pancreas, the transverse colon, and numerous loops  of small bowel. No evidence of bowel obstruction or other acute complication. 3. There is dense atelectasis and/or consolidation of almost the entirety of the left lower lobe as well as portions of the right lower lobe, compressive secondary to large hernia. 4. There are scattered areas of acute ground-glass and heterogeneous airspace opacity throughout the remainder of the lungs, in keeping with COVID airspace disease. 5. Coronary artery disease. Aortic Atherosclerosis (ICD10-I70.0). Electronically Signed   By: Lauralyn Primes M.D.   On: 11/05/2020 11:36   DG Chest Portable 1 View  Result Date: 11/04/2020 CLINICAL DATA:  74 year old male with positive COVID-19. Hypoxia. EXAM: PORTABLE CHEST 1 VIEW COMPARISON:  None. FINDINGS: Large area of opacity over lower lungs primarily left lung base likely combination of large hiatal hernia and associated atelectasis. Pneumonia is not excluded. No pleural effusion pneumothorax. Atherosclerotic calcification of the aorta. No acute osseous pathology. IMPRESSION: Large hiatal hernia and associated atelectasis. Pneumonia is not excluded. Electronically Signed   By: Elgie Collard M.D.   On: 11/04/2020 18:30               LOS: 2 days   Louine Tenpenny  [facility]   Pager on [email or web address]. If 7PM-7AM, please contact night-coverage at [email or web address]     11/06/2020, 11:06 AM

## 2020-11-07 DIAGNOSIS — J1282 Pneumonia due to coronavirus disease 2019: Secondary | ICD-10-CM

## 2020-11-07 DIAGNOSIS — J9600 Acute respiratory failure, unspecified whether with hypoxia or hypercapnia: Secondary | ICD-10-CM | POA: Diagnosis not present

## 2020-11-07 DIAGNOSIS — U071 COVID-19: Secondary | ICD-10-CM | POA: Diagnosis not present

## 2020-11-07 LAB — CBC WITH DIFFERENTIAL/PLATELET
Abs Immature Granulocytes: 0.03 10*3/uL (ref 0.00–0.07)
Basophils Absolute: 0 10*3/uL (ref 0.0–0.1)
Basophils Relative: 0 %
Eosinophils Absolute: 0 10*3/uL (ref 0.0–0.5)
Eosinophils Relative: 0 %
HCT: 47.6 % (ref 39.0–52.0)
Hemoglobin: 15.4 g/dL (ref 13.0–17.0)
Immature Granulocytes: 1 %
Lymphocytes Relative: 16 %
Lymphs Abs: 0.7 10*3/uL (ref 0.7–4.0)
MCH: 30.7 pg (ref 26.0–34.0)
MCHC: 32.4 g/dL (ref 30.0–36.0)
MCV: 95 fL (ref 80.0–100.0)
Monocytes Absolute: 0.5 10*3/uL (ref 0.1–1.0)
Monocytes Relative: 12 %
Neutro Abs: 3.2 10*3/uL (ref 1.7–7.7)
Neutrophils Relative %: 71 %
Platelets: 144 10*3/uL — ABNORMAL LOW (ref 150–400)
RBC: 5.01 MIL/uL (ref 4.22–5.81)
RDW: 12.7 % (ref 11.5–15.5)
WBC: 4.5 10*3/uL (ref 4.0–10.5)
nRBC: 0 % (ref 0.0–0.2)

## 2020-11-07 LAB — COMPREHENSIVE METABOLIC PANEL
ALT: 33 U/L (ref 0–44)
AST: 58 U/L — ABNORMAL HIGH (ref 15–41)
Albumin: 2.8 g/dL — ABNORMAL LOW (ref 3.5–5.0)
Alkaline Phosphatase: 50 U/L (ref 38–126)
Anion gap: 7 (ref 5–15)
BUN: 26 mg/dL — ABNORMAL HIGH (ref 8–23)
CO2: 29 mmol/L (ref 22–32)
Calcium: 8.2 mg/dL — ABNORMAL LOW (ref 8.9–10.3)
Chloride: 104 mmol/L (ref 98–111)
Creatinine, Ser: 0.99 mg/dL (ref 0.61–1.24)
GFR, Estimated: >60 mL/min (ref >60)
Glucose, Bld: 150 mg/dL — ABNORMAL HIGH (ref 70–99)
Potassium: 4.4 mmol/L (ref 3.5–5.1)
Sodium: 140 mmol/L (ref 135–145)
Total Bilirubin: 1 mg/dL (ref 0.3–1.2)
Total Protein: 6.2 g/dL — ABNORMAL LOW (ref 6.5–8.1)

## 2020-11-07 LAB — FIBRIN DERIVATIVES D-DIMER (ARMC ONLY): Fibrin derivatives D-dimer (ARMC): 674.08 ng/mL (FEU) — ABNORMAL HIGH (ref 0.00–499.00)

## 2020-11-07 LAB — C-REACTIVE PROTEIN: CRP: 0.6 mg/dL (ref <1.0)

## 2020-11-07 MED ORDER — SODIUM CHLORIDE 0.9 % IV SOLN
INTRAVENOUS | Status: DC | PRN
  Administered 2020-11-07: 09:00:00 250 mL via INTRAVENOUS

## 2020-11-07 MED ORDER — ENOXAPARIN SODIUM 60 MG/0.6ML ~~LOC~~ SOLN
60.0000 mg | SUBCUTANEOUS | Status: DC
  Administered 2020-11-08 – 2020-11-09 (×2): 60 mg via SUBCUTANEOUS
  Filled 2020-11-07 (×2): qty 0.6

## 2020-11-07 NOTE — Progress Notes (Addendum)
Progress Note    Johnny Reed  [id number] DOB: 30-Jul-1946  DOA: 11/04/2020 PCP: Patient, No Pcp Per      Brief Narrative:    Medical records reviewed and are as summarized below:  Johnny Reed is a 74 y.o. male       Assessment/Plan:   Principal Problem:   Pneumonia due to COVID-19 virus Active Problems:   Acute respiratory failure due to COVID-19 [facility])   Hyperlipidemia   Nonalcoholic fatty liver disease    Body mass index is 30.59 kg/m. ( Obesity)   COVID-19 pneumonia: Continue IV remdesivir and IV dexamethasone.  Continue incentive spirometer as needed.  No evidence of PE on CTA of the chest.  Acute hypoxic respiratory failure: He is down to 3 L/min oxygen via nasal cannula.  Continue to taper down oxygen as able.  AKI: Resolved.  Large hiatal hernia noted on CT chest: Asymptomatic.  No evidence of bowel obstruction.   Diet Order            Diet regular Room service appropriate? Yes; Fluid consistency: Thin  Diet effective now                    Consultants:  None  Procedures:  None    Medications:   . albuterol  2 puff Inhalation Q6H  . vitamin C  500 mg Oral Daily  . dexamethasone (DECADRON) injection  6 mg Intravenous Q24H  . enoxaparin (LOVENOX) injection  60 mg Subcutaneous Q24H  . multivitamin with minerals  1 tablet Oral Daily  . zinc sulfate  220 mg Oral Daily   Continuous Infusions: . sodium chloride 250 mL (11/07/20 0905)  . remdesivir 100 mg in NS 100 mL 100 mg (11/07/20 0907)     Anti-infectives (From admission, onward)   Start     Dose/Rate Route Frequency Ordered Stop   11/05/20 1000  remdesivir 100 mg in sodium chloride 0.9 % 100 mL IVPB       "Followed by" Linked Group Details   100 mg 200 mL/hr over 30 Minutes Intravenous Daily 11/04/20 1819 11/09/20 0959   11/05/20 1000  remdesivir 100 mg in sodium chloride 0.9 % 100 mL IVPB  Status:  Discontinued       "Followed by" Linked Group Details   100  mg 200 mL/hr over 30 Minutes Intravenous Daily 11/04/20 1959 11/04/20 2038   11/04/20 2000  remdesivir 200 mg in sodium chloride 0.9% 250 mL IVPB  Status:  Discontinued       "Followed by" Linked Group Details   200 mg 580 mL/hr over 30 Minutes Intravenous Once 11/04/20 1959 11/04/20 2038   11/04/20 1830  remdesivir 200 mg in sodium chloride 0.9% 250 mL IVPB       "Followed by" Linked Group Details   200 mg 580 mL/hr over 30 Minutes Intravenous Once 11/04/20 1819 11/04/20 2059             Family Communication/Anticipated D/C date and plan/Code Status   DVT prophylaxis:      Code Status: Full Code  Family Communication: None Disposition Plan:    Status is: Inpatient  Remains inpatient appropriate because:IV treatments appropriate due to intensity of illness or inability to take PO   Dispo: The patient is from: Home              Anticipated d/c is to: Home              Anticipated d/c  date is: > 3 days              Patient currently is not medically stable to d/c.           Subjective:   Interval events noted.  He complains of cough.  He is short of breath with exertion but he's okay at rest.  Objective:    Vitals:   11/06/20 2140 11/07/20 0029 11/07/20 0504 11/07/20 0743  BP:  121/81 112/78 113/77  Pulse: 84 72 66 66  Resp:  16 16 20   Temp:  98 F (36.7 C) 98.1 F (36.7 C) 98 F (36.7 C)  TempSrc:  Oral Oral   SpO2:  91% 93% 95%  Weight:      Height:       No data found.   Intake/Output Summary (Last 24 hours) at 11/07/2020 1039 Last data filed at 11/07/2020 0515 Gross per 24 hour  Intake --  Output 500 ml  Net -500 ml   Filed Weights   11/04/20 2240  Weight: 117 kg    Exam:   GEN: NAD SKIN: Warm and dry EYES: No pallor or icterus ENT: MMM CV: RRR PULM: Bibasilar rales, no wheezing heard ABD: soft, obese, NT, +BS CNS: AAO x 3, non focal EXT: No edema or tenderness    Data Reviewed:   I have personally reviewed  following labs and imaging studies:  Labs: Labs show the following:   Basic Metabolic Panel: Recent Labs  Lab 11/04/20 1838 11/05/20 0427 11/06/20 0421 11/07/20 0409  NA 139 142 139 140  K 3.9 4.4 4.2 4.4  CL 101 100 101 104  CO2 27 30 29 29   GLUCOSE 113* 177* 196* 150*  BUN 17 23 28* 26*  CREATININE 1.26* 1.56* 1.18 0.99  CALCIUM 8.1* 8.7* 8.1* 8.2*   GFR Estimated Creatinine Clearance: 95.7 mL/min (by C-G formula based on SCr of 0.99 mg/dL). Liver Function Tests: Recent Labs  Lab 11/05/20 0427 11/06/20 0421 11/07/20 0409  AST 82* 62* 58*  ALT 42 36 33  ALKPHOS 55 50 50  BILITOT 1.1 0.9 1.0  PROT 7.1 6.5 6.2*  ALBUMIN 3.3* 2.9* 2.8*   No results for input(s): LIPASE, AMYLASE in the last 168 hours. No results for input(s): AMMONIA in the last 168 hours. Coagulation profile No results for input(s): INR, PROTIME in the last 168 hours.  CBC: Recent Labs  Lab 11/04/20 1838 11/05/20 0427 11/06/20 0421 11/07/20 0409  WBC 3.8* 2.9* 4.3 4.5  NEUTROABS  --  2.0 3.1 3.2  HGB 16.1 16.8 16.0 15.4  HCT 48.7 51.8 48.0 47.6  MCV 92.9 94.2 93.8 95.0  PLT 108* 113* 127* 144*   Cardiac Enzymes: No results for input(s): CKTOTAL, CKMB, CKMBINDEX, TROPONINI in the last 168 hours. BNP (last 3 results) No results for input(s): PROBNP in the last 8760 hours. CBG: Recent Labs  Lab 11/05/20 0848 11/05/20 1135 11/05/20 1703 11/05/20 1954  GLUCAP 134* 119* 112* 130*   D-Dimer: No results for input(s): DDIMER in the last 72 hours. Hgb A1c: No results for input(s): HGBA1C in the last 72 hours. Lipid Profile: No results for input(s): CHOL, HDL, LDLCALC, TRIG, CHOLHDL, LDLDIRECT in the last 72 hours. Thyroid function studies: No results for input(s): TSH, T4TOTAL, T3FREE, THYROIDAB in the last 72 hours.  Invalid input(s): FREET3 Anemia work up: No results for input(s): VITAMINB12, FOLATE, FERRITIN, TIBC, IRON, RETICCTPCT in the last 72 hours. Sepsis Labs: Recent Labs   Lab 11/04/20 1838 11/05/20  0998 11/06/20 0421 11/07/20 0409  WBC 3.8* 2.9* 4.3 4.5    Microbiology No results found for this or any previous visit (from the past 240 hour(s)).  Procedures and diagnostic studies:  CT ANGIO CHEST PE W OR WO CONTRAST  Result Date: 11/05/2020 CLINICAL DATA:  PE suspected, COVID positive EXAM: CT ANGIOGRAPHY CHEST WITH CONTRAST TECHNIQUE: Multidetector CT imaging of the chest was performed using the standard protocol during bolus administration of intravenous contrast. Multiplanar CT image reconstructions and MIPs were obtained to evaluate the vascular anatomy. CONTRAST:  100 mL Omnipaque 350 iodinated contrast IV COMPARISON:  None. FINDINGS: Cardiovascular: Satisfactory opacification of the pulmonary arteries to the segmental level. No evidence of pulmonary embolism. Normal heart size. No pericardial effusion. Aortic atherosclerosis. Scattered coronary artery calcifications. Mediastinum/Nodes: No enlarged mediastinal, hilar, or axillary lymph nodes. There is a very large hiatal/paraesophageal hernia, containing the entirety of the stomach, the pancreas, the transverse colon, and numerous loops of small bowel (series 7, image 65). Thyroid gland, trachea, and esophagus demonstrate no significant findings. Lungs/Pleura: There is dense atelectasis and/or consolidation of almost the entirety of the left lower lobe as well as portions of the right lower lobe, compressive secondary to large hernia. There are scattered areas of ground-glass and heterogeneous airspace opacity throughout the remainder of the lungs. No pleural effusion or pneumothorax. Upper Abdomen: No acute abnormality. Musculoskeletal: No chest wall abnormality. No acute or significant osseous findings. Review of the MIP images confirms the above findings. IMPRESSION: 1. Negative examination for pulmonary embolism. 2. There is a very large hiatal/paraesophageal hernia, containing the entirety of the stomach,  the pancreas, the transverse colon, and numerous loops of small bowel. No evidence of bowel obstruction or other acute complication. 3. There is dense atelectasis and/or consolidation of almost the entirety of the left lower lobe as well as portions of the right lower lobe, compressive secondary to large hernia. 4. There are scattered areas of acute ground-glass and heterogeneous airspace opacity throughout the remainder of the lungs, in keeping with COVID airspace disease. 5. Coronary artery disease. Aortic Atherosclerosis (ICD10-I70.0). Electronically Signed   By: Lauralyn Primes M.D.   On: 11/05/2020 11:36               LOS: 3 days   Tiombe Tomeo  [facility]   Pager on [email or web address]. If 7PM-7AM, please contact night-coverage at [email or web address]     11/07/2020, 10:39 AM

## 2020-11-08 DIAGNOSIS — J9600 Acute respiratory failure, unspecified whether with hypoxia or hypercapnia: Secondary | ICD-10-CM | POA: Diagnosis not present

## 2020-11-08 DIAGNOSIS — U071 COVID-19: Secondary | ICD-10-CM | POA: Diagnosis not present

## 2020-11-08 DIAGNOSIS — J1282 Pneumonia due to coronavirus disease 2019: Secondary | ICD-10-CM | POA: Diagnosis not present

## 2020-11-08 LAB — COMPREHENSIVE METABOLIC PANEL
ALT: 40 U/L (ref 0–44)
AST: 70 U/L — ABNORMAL HIGH (ref 15–41)
Albumin: 3 g/dL — ABNORMAL LOW (ref 3.5–5.0)
Alkaline Phosphatase: 57 U/L (ref 38–126)
Anion gap: 8 (ref 5–15)
BUN: 24 mg/dL — ABNORMAL HIGH (ref 8–23)
CO2: 30 mmol/L (ref 22–32)
Calcium: 8.5 mg/dL — ABNORMAL LOW (ref 8.9–10.3)
Chloride: 105 mmol/L (ref 98–111)
Creatinine, Ser: 0.91 mg/dL (ref 0.61–1.24)
GFR, Estimated: >60 mL/min (ref >60)
Glucose, Bld: 156 mg/dL — ABNORMAL HIGH (ref 70–99)
Potassium: 4.1 mmol/L (ref 3.5–5.1)
Sodium: 143 mmol/L (ref 135–145)
Total Bilirubin: 1.1 mg/dL (ref 0.3–1.2)
Total Protein: 6.5 g/dL (ref 6.5–8.1)

## 2020-11-08 LAB — CBC WITH DIFFERENTIAL/PLATELET
Abs Immature Granulocytes: 0.02 10*3/uL (ref 0.00–0.07)
Basophils Absolute: 0 10*3/uL (ref 0.0–0.1)
Basophils Relative: 0 %
Eosinophils Absolute: 0 10*3/uL (ref 0.0–0.5)
Eosinophils Relative: 0 %
HCT: 47.9 % (ref 39.0–52.0)
Hemoglobin: 15.9 g/dL (ref 13.0–17.0)
Immature Granulocytes: 1 %
Lymphocytes Relative: 17 %
Lymphs Abs: 0.7 10*3/uL (ref 0.7–4.0)
MCH: 31.2 pg (ref 26.0–34.0)
MCHC: 33.2 g/dL (ref 30.0–36.0)
MCV: 94.1 fL (ref 80.0–100.0)
Monocytes Absolute: 0.4 10*3/uL (ref 0.1–1.0)
Monocytes Relative: 11 %
Neutro Abs: 3 10*3/uL (ref 1.7–7.7)
Neutrophils Relative %: 71 %
Platelets: 155 10*3/uL (ref 150–400)
RBC: 5.09 MIL/uL (ref 4.22–5.81)
RDW: 12.7 % (ref 11.5–15.5)
WBC: 4.1 10*3/uL (ref 4.0–10.5)
nRBC: 0 % (ref 0.0–0.2)

## 2020-11-08 LAB — FIBRIN DERIVATIVES D-DIMER (ARMC ONLY): Fibrin derivatives D-dimer (ARMC): 549.74 ng/mL (FEU) — ABNORMAL HIGH (ref 0.00–499.00)

## 2020-11-08 LAB — C-REACTIVE PROTEIN: CRP: 0.6 mg/dL (ref <1.0)

## 2020-11-08 MED ORDER — DEXAMETHASONE 4 MG PO TABS
6.0000 mg | ORAL_TABLET | Freq: Every day | ORAL | Status: DC
  Administered 2020-11-08: 21:00:00 6 mg via ORAL
  Filled 2020-11-08: qty 2

## 2020-11-08 MED ORDER — POLYETHYLENE GLYCOL 3350 17 G PO PACK
17.0000 g | PACK | Freq: Every day | ORAL | Status: DC | PRN
  Administered 2020-11-09: 08:00:00 17 g via ORAL
  Filled 2020-11-08: qty 1

## 2020-11-08 MED ORDER — DOCUSATE SODIUM 100 MG PO CAPS: 100.0000 mg | ORAL_CAPSULE | Freq: Two times a day (BID) | ORAL | Status: DC | PRN

## 2020-11-08 NOTE — Care Management Important Message (Signed)
Important Message  Patient Details  Name: Johnny Reed MRN: [id number] Date of Birth: 1946-07-15   Medicare Important Message Given:  N/A - LOS <3 / Initial given by admissions  Initial Medicare IM reviewed with patient by Francesca Oman, Patient Access Associate on 11/07/2020 at 12:43pm.   Johnell Comings 11/08/2020, 8:22 AM

## 2020-11-08 NOTE — Progress Notes (Signed)
Progress Note    Johnny Reed  WLN:[id number] DOB: Dec 31, 1945  DOA: 11/04/2020 PCP: Patient, No Pcp Per      Brief Narrative:    Medical records reviewed and are as summarized below:  Johnny Reed is a 74 y.o. male with medical history significant for obesity, hyperlipidemia, nonalcoholic fatty liver disease, recent diagnosis of COVID-19 infection on 10/29/2020.  He presented to the hospital because of cough, shortness of breath, fever and malaise.  He had one episode of watery loose stools prior to coming to the hospital.  In the emergency room, he was febrile with temperature of 102.2 and hypoxic with oxygen saturation of 87% on room air.  He was found to have COVID-19 multifocal pneumonia complicated by acute hypoxic respiratory failure.  He was treated with IV steroids and IV remdesivir.  He required up to 4 L/min oxygen via nasal cannula.  Incidentally, he also has large hiatal hernia noted on CT chest.    Assessment/Plan:   Principal Problem:   Pneumonia due to COVID-19 virus Active Problems:   Acute respiratory failure due to COVID-19 [facility])   Hyperlipidemia   Nonalcoholic fatty liver disease    Body mass index is 30.59 kg/m. ( Obesity)   COVID-19 pneumonia: Continue IV dexamethasone and IV remdesivir.  Continue incentive spirometer as needed.  No evidence of PE on CT of the chest.   Acute hypoxic respiratory failure: He is still on 3 L/min oxygen via nasal cannula.  Taper off oxygen as able.   Constipation: Laxatives as needed.   AKI: Resolved.  Large hiatal hernia noted on CT chest: Asymptomatic.  No evidence of bowel obstruction.   Diet Order            Diet regular Room service appropriate? Yes; Fluid consistency: Thin  Diet effective now                    Consultants:  None  Procedures:  None    Medications:   . albuterol  2 puff Inhalation Q6H  . vitamin C  500 mg Oral Daily  . dexamethasone (DECADRON) injection  6 mg  Intravenous Q24H  . enoxaparin (LOVENOX) injection  60 mg Subcutaneous Q24H  . multivitamin with minerals  1 tablet Oral Daily  . zinc sulfate  220 mg Oral Daily   Continuous Infusions: . sodium chloride 250 mL (11/07/20 0905)     Anti-infectives (From admission, onward)   Start     Dose/Rate Route Frequency Ordered Stop   11/05/20 1000  remdesivir 100 mg in sodium chloride 0.9 % 100 mL IVPB       "Followed by" Linked Group Details   100 mg 200 mL/hr over 30 Minutes Intravenous Daily 11/04/20 1819 11/08/20 0842   11/05/20 1000  remdesivir 100 mg in sodium chloride 0.9 % 100 mL IVPB  Status:  Discontinued       "Followed by" Linked Group Details   100 mg 200 mL/hr over 30 Minutes Intravenous Daily 11/04/20 1959 11/04/20 2038   11/04/20 2000  remdesivir 200 mg in sodium chloride 0.9% 250 mL IVPB  Status:  Discontinued       "Followed by" Linked Group Details   200 mg 580 mL/hr over 30 Minutes Intravenous Once 11/04/20 1959 11/04/20 2038   11/04/20 1830  remdesivir 200 mg in sodium chloride 0.9% 250 mL IVPB       "Followed by" Linked Group Details   200 mg 580 mL/hr over 30 Minutes  Intravenous Once 11/04/20 1819 11/04/20 2059             Family Communication/Anticipated D/C date and plan/Code Status   DVT prophylaxis:      Code Status: Full Code  Family Communication: None Disposition Plan:    Status is: Inpatient  Remains inpatient appropriate because:IV treatments appropriate due to intensity of illness or inability to take PO   Dispo: The patient is from: Home              Anticipated d/c is to: Home              Anticipated d/c date is: > 3 days              Patient currently is not medically stable to d/c.           Subjective:   Interval events noted.  His breathing is okay at rest.  He has an occasional cough.  He complains of constipation.  No abdominal pain or vomiting.  Objective:    Vitals:   11/07/20 2107 11/07/20 2330 11/08/20 0626  11/08/20 0806  BP: 116/78 112/75 124/82 98/78  Pulse: 65 66 68 (!) 58  Resp: 16 18 16 16   Temp: 97.9 F (36.6 C) 98 F (36.7 C) (!) 97.3 F (36.3 C) 97.6 F (36.4 C)  TempSrc: Oral  Oral Oral  SpO2: 93% 92% 96% 95%  Weight:      Height:       No data found.   Intake/Output Summary (Last 24 hours) at 11/08/2020 1053 Last data filed at 11/08/2020 11/10/2020 Gross per 24 hour  Intake 246.45 ml  Output 525 ml  Net -278.55 ml   Filed Weights   11/04/20 2240  Weight: 117 kg    Exam:   GEN: NAD SKIN: Warm and dry EYES: No pallor or icterus ENT: MMM CV: RRR PULM: CTA B ABD: soft, obese, NT, +BS CNS: AAO x 3, non focal EXT: No edema or tenderness       Data Reviewed:   I have personally reviewed following labs and imaging studies:  Labs: Labs show the following:   Basic Metabolic Panel: Recent Labs  Lab 11/04/20 1838 11/05/20 0427 11/06/20 0421 11/07/20 0409 11/08/20 0455  NA 139 142 139 140 143  K 3.9 4.4 4.2 4.4 4.1  CL 101 100 101 104 105  CO2 27 30 29 29 30   GLUCOSE 113* 177* 196* 150* 156*  BUN 17 23 28* 26* 24*  CREATININE 1.26* 1.56* 1.18 0.99 0.91  CALCIUM 8.1* 8.7* 8.1* 8.2* 8.5*   GFR Estimated Creatinine Clearance: 104.1 mL/min (by C-G formula based on SCr of 0.91 mg/dL). Liver Function Tests: Recent Labs  Lab 11/05/20 0427 11/06/20 0421 11/07/20 0409 11/08/20 0455  AST 82* 62* 58* 70*  ALT 42 36 33 40  ALKPHOS 55 50 50 57  BILITOT 1.1 0.9 1.0 1.1  PROT 7.1 6.5 6.2* 6.5  ALBUMIN 3.3* 2.9* 2.8* 3.0*   No results for input(s): LIPASE, AMYLASE in the last 168 hours. No results for input(s): AMMONIA in the last 168 hours. Coagulation profile No results for input(s): INR, PROTIME in the last 168 hours.  CBC: Recent Labs  Lab 11/04/20 1838 11/05/20 0427 11/06/20 0421 11/07/20 0409 11/08/20 0455  WBC 3.8* 2.9* 4.3 4.5 4.1  NEUTROABS  --  2.0 3.1 3.2 3.0  HGB 16.1 16.8 16.0 15.4 15.9  HCT 48.7 51.8 48.0 47.6 47.9  MCV 92.9  94.2 93.8 95.0 94.1  PLT 108* 113* 127* 144* 155   Cardiac Enzymes: No results for input(s): CKTOTAL, CKMB, CKMBINDEX, TROPONINI in the last 168 hours. BNP (last 3 results) No results for input(s): PROBNP in the last 8760 hours. CBG: Recent Labs  Lab 11/05/20 0848 11/05/20 1135 11/05/20 1703 11/05/20 1954  GLUCAP 134* 119* 112* 130*   D-Dimer: No results for input(s): DDIMER in the last 72 hours. Hgb A1c: No results for input(s): HGBA1C in the last 72 hours. Lipid Profile: No results for input(s): CHOL, HDL, LDLCALC, TRIG, CHOLHDL, LDLDIRECT in the last 72 hours. Thyroid function studies: No results for input(s): TSH, T4TOTAL, T3FREE, THYROIDAB in the last 72 hours.  Invalid input(s): FREET3 Anemia work up: No results for input(s): VITAMINB12, FOLATE, FERRITIN, TIBC, IRON, RETICCTPCT in the last 72 hours. Sepsis Labs: Recent Labs  Lab 11/05/20 0427 11/06/20 0421 11/07/20 0409 11/08/20 0455  WBC 2.9* 4.3 4.5 4.1    Microbiology No results found for this or any previous visit (from the past 240 hour(s)).  Procedures and diagnostic studies:  No results found.             LOS: 4 days   Corry Ihnen  [facility]   Pager on [email or web address]. If 7PM-7AM, please contact night-coverage at [email or web address]     11/08/2020, 10:53 AM

## 2020-11-09 DIAGNOSIS — J1282 Pneumonia due to coronavirus disease 2019: Secondary | ICD-10-CM | POA: Diagnosis not present

## 2020-11-09 DIAGNOSIS — U071 COVID-19: Secondary | ICD-10-CM | POA: Diagnosis not present

## 2020-11-09 LAB — CBC WITH DIFFERENTIAL/PLATELET
Abs Immature Granulocytes: 0.03 10*3/uL (ref 0.00–0.07)
Basophils Absolute: 0 10*3/uL (ref 0.0–0.1)
Basophils Relative: 0 %
Eosinophils Absolute: 0 10*3/uL (ref 0.0–0.5)
Eosinophils Relative: 0 %
HCT: 46.7 % (ref 39.0–52.0)
Hemoglobin: 15.5 g/dL (ref 13.0–17.0)
Immature Granulocytes: 1 %
Lymphocytes Relative: 13 %
Lymphs Abs: 0.6 10*3/uL — ABNORMAL LOW (ref 0.7–4.0)
MCH: 30.9 pg (ref 26.0–34.0)
MCHC: 33.2 g/dL (ref 30.0–36.0)
MCV: 93.2 fL (ref 80.0–100.0)
Monocytes Absolute: 0.4 10*3/uL (ref 0.1–1.0)
Monocytes Relative: 9 %
Neutro Abs: 3.5 10*3/uL (ref 1.7–7.7)
Neutrophils Relative %: 77 %
Platelets: 144 10*3/uL — ABNORMAL LOW (ref 150–400)
RBC: 5.01 MIL/uL (ref 4.22–5.81)
RDW: 12.8 % (ref 11.5–15.5)
WBC: 4.5 10*3/uL (ref 4.0–10.5)
nRBC: 0 % (ref 0.0–0.2)

## 2020-11-09 LAB — COMPREHENSIVE METABOLIC PANEL
ALT: 44 U/L (ref 0–44)
AST: 67 U/L — ABNORMAL HIGH (ref 15–41)
Albumin: 3 g/dL — ABNORMAL LOW (ref 3.5–5.0)
Alkaline Phosphatase: 55 U/L (ref 38–126)
Anion gap: 9 (ref 5–15)
BUN: 23 mg/dL (ref 8–23)
CO2: 27 mmol/L (ref 22–32)
Calcium: 8.2 mg/dL — ABNORMAL LOW (ref 8.9–10.3)
Chloride: 103 mmol/L (ref 98–111)
Creatinine, Ser: 0.81 mg/dL (ref 0.61–1.24)
GFR, Estimated: >60 mL/min (ref >60)
Glucose, Bld: 165 mg/dL — ABNORMAL HIGH (ref 70–99)
Potassium: 4.2 mmol/L (ref 3.5–5.1)
Sodium: 139 mmol/L (ref 135–145)
Total Bilirubin: 1.3 mg/dL — ABNORMAL HIGH (ref 0.3–1.2)
Total Protein: 6.4 g/dL — ABNORMAL LOW (ref 6.5–8.1)

## 2020-11-09 LAB — FIBRIN DERIVATIVES D-DIMER (ARMC ONLY): Fibrin derivatives D-dimer (ARMC): 464.88 ng/mL (FEU) (ref 0.00–499.00)

## 2020-11-09 LAB — C-REACTIVE PROTEIN: CRP: 0.6 mg/dL (ref <1.0)

## 2020-11-09 MED ORDER — DEXAMETHASONE 6 MG PO TABS: 6.0000 mg | ORAL_TABLET | Freq: Every day | ORAL | 0 refills | Status: AC

## 2020-11-09 MED ORDER — ALBUTEROL SULFATE HFA 108 (90 BASE) MCG/ACT IN AERS: 2.0000 | INHALATION_SPRAY | Freq: Four times a day (QID) | RESPIRATORY_TRACT | 0 refills | Status: AC | PRN

## 2020-11-09 MED ORDER — ZINC SULFATE 220 (50 ZN) MG PO CAPS: 220.0000 mg | ORAL_CAPSULE | Freq: Every day | ORAL | 0 refills | Status: AC

## 2020-11-09 MED ORDER — ASCORBIC ACID 500 MG PO TABS: 500.0000 mg | ORAL_TABLET | Freq: Every day | ORAL | 0 refills | Status: AC

## 2020-11-09 MED ORDER — GUAIFENESIN-DM 100-10 MG/5ML PO SYRP: 10.0000 mL | ORAL_SOLUTION | ORAL | 0 refills | Status: AC | PRN

## 2020-11-09 NOTE — Progress Notes (Signed)
SATURATION QUALIFICATIONS: (This note is used to comply with regulatory documentation for home oxygen)  Patient Saturations on Room Air at Rest = 87%  Patient Saturations on 3L while Ambulating = 85%  Patient Saturations on 4 Liters of oxygen while Ambulating = 90%  Please briefly explain why patient needs home oxygen: with ambulation

## 2020-11-09 NOTE — Progress Notes (Signed)
Called pt's wife and talking through discharge instructions and oxygen use. Will also demonstrate with her when she comes to pick up patient. Wife verbalizes understanding of instructions. Instructions also reviewed with patient.

## 2020-11-09 NOTE — Clinical Social Work Note (Addendum)
Ordered oxygen through [employer]. They are aware he will need enough tanks to get him home to [place] and confirmed they have an office where he lives in [place].  Charlynn Court, CSW [phone]  11:22 am: Tried calling wife. Message said phone is not accepting incoming calls at this time.  Charlynn Court, CSW [phone]  2:30 pm: Three tanks delivered to room. Patient's wife will call [employer] when they get to family member's home so they can bring out the concentrator. No further concerns. CSW signing off.

## 2020-11-09 NOTE — Discharge Summary (Signed)
Physician Discharge Summary  Johnny Reed RUE:[id number] DOB: [birth year] DOA: 11/04/2020  PCP: Patient, No Pcp Per  Admit date: 11/04/2020 Discharge date: 11/09/2020  Admitted From: Home Disposition: Home  Recommendations for Outpatient Follow-up:  1. Follow up with PCP in 1-2 weeks 2. Continue supple oxygen, 5 L nasal cannula with exertion 3. Continue albuterol MDI as needed for shortness of breath/wheezing 4. Continue supportive care with vitamin C/zinc 5. Please obtain CMP/CBC in one week  Home Health: No Equipment/Devices: Oxygen, 5 L nasal cannula with exertion  Discharge Condition: Stable CODE STATUS: Full code Diet recommendation: Regular diet  History of present illness:  Johnny Showersllyn Romey is a [age] y.o. male with medical history significant for obesity, hyperlipidemia, nonalcoholic fatty liver disease, recent diagnosis of COVID-19 infection on 10/29/2020.  He presented to the hospital because of cough, shortness of breath, fever and malaise.  He had one episode of watery loose stools prior to coming to the hospital.  In the emergency room, he was febrile with temperature of 102.2 and hypoxic with oxygen saturation of 87% on room air.  He was found to have COVID-19 multifocal pneumonia complicated by acute hypoxic respiratory failure.  He was treated with IV steroids and IV remdesivir.  He required up to 4 L/min oxygen via nasal cannula.  Incidentally, he also has large hiatal hernia noted on CT chest  Hospital course:  Acute hypoxic respiratory failure secondary to acute Covid-19 viral pneumonia during the ongoing 2020 Covid 19 Pandemic - POA Patient presenting to the ED with progressive shortness of breath, recently diagnosed with Covid-19 infection on 10/29/2020.  On arrival, patient with a temperature of 102.2 with SPO2 of 87% on room air.  Patient completed 5-day course of remdesivir on 11/04/2020.  Patient was also started on dexamethasone and will complete a 10-day course.   Patient continued to require submental oxygen with exertion up to 5 L per nasal cannula without any significant symptoms.  Patient will discharge home with supplemental oxygen, 5 L per nasal cannula, albuterol MDI as needed, vitamin C/zinc.  Patient to follow[place]up [place]CP on [place].  Continue Covid-19 isolation guidelines per CDC.  The treatment plan and use of medications and known side effects were discussed with patient/family. Some of the medications used are based on case reports/anecdotal data.  All other medications being used in the management of COVID-19 based on limited study data.  Complete risks and long-term side effects are unknown, however in the best clinical judgment they seem to be of some benefit.  Patient wanted to proceed with treatment options provided.  Acute renal failure: resolved Creatinine trended up to 1.56 on admission, likely secondary to prerenal/dehydration from acute Covid infection as above.  Patient received IV fluid hydration with improvement of his creatinine to 0.81 at time of discharge.  Large hiatal hernia Incidental finding of a large hiatal hernia on CT chest, patient asymptomatic.  No evidence of bowel obstruction.  Outpatient follow-up.  Discharge Diagnoses:  Principal Problem:   Pneumonia due to COVID-19 virus Active Problems:   Hyperlipidemia   Nonalcoholic fatty liver disease   Acute respiratory failure due to COVID-19 [facility])    Discharge Instructions  Discharge Instructions    Call MD for:  difficulty breathing, headache or visual disturbances   Complete by: As directed    Call MD for:  extreme fatigue   Complete by: As directed    Call MD for:  persistant dizziness or light-headedness   Complete by: As directed  Call MD for:  persistant nausea and vomiting   Complete by: As directed    Call MD for:  severe uncontrolled pain   Complete by: As directed    Call MD for:  temperature >100.4   Complete by: As directed     Diet - low sodium heart healthy   Complete by: As directed    Increase activity slowly   Complete by: As directed      Allergies as of 11/09/2020   No Known Allergies     Medication List    TAKE these medications   albuterol 108 (90 Base) MCG/ACT inhaler Commonly known as: VENTOLIN HFA Inhale 2 puffs into the lungs every 6 (six) hours as needed for wheezing or shortness of breath.   ascorbic acid 500 MG tablet Commonly known as: VITAMIN C Take 1 tablet (500 mg total) by mouth daily. Start taking on: November 10, 2020   dexamethasone 6 MG tablet Commonly known as: DECADRON Take 1 tablet (6 mg total) by mouth daily for 4 days. Start taking on: November 10, 2020   fluticasone 50 MCG/ACT nasal spray Commonly known as: FLONASE Place 1 spray into both nostrils daily.   guaiFENesin-dextromethorphan 100-10 MG/5ML syrup Commonly known as: ROBITUSSIN DM Take 10 mLs by mouth every 4 (four) hours as needed for cough.   zinc sulfate 220 (50 Zn) MG capsule Take 1 capsule (220 mg total) by mouth daily. Start taking on: November 10, 2020            Durable Medical Equipment  (From admission, onward)         Start     Ordered   11/09/20 2025  For home use only DME oxygen  Once       Question Answer Comment  Length of Need 12 Months   Mode or (Route) Nasal cannula   Liters per Minute 4   Frequency Continuous (stationary and portable oxygen unit needed)   Oxygen conserving device Yes   Oxygen delivery system Gas      11/09/20 0921          No Known Allergies  Consultations:  None   Procedures/Studies: CT ANGIO CHEST PE W OR WO CONTRAST  Result Date: 11/05/2020 CLINICAL DATA:  PE suspected, COVID positive EXAM: CT ANGIOGRAPHY CHEST WITH CONTRAST TECHNIQUE: Multidetector CT imaging of the chest was performed using the standard protocol during bolus administration of intravenous contrast. Multiplanar CT image reconstructions and MIPs were obtained to evaluate  the vascular anatomy. CONTRAST:  100 mL Omnipaque 350 iodinated contrast IV COMPARISON:  None. FINDINGS: Cardiovascular: Satisfactory opacification of the pulmonary arteries to the segmental level. No evidence of pulmonary embolism. Normal heart size. No pericardial effusion. Aortic atherosclerosis. Scattered coronary artery calcifications. Mediastinum/Nodes: No enlarged mediastinal, hilar, or axillary lymph nodes. There is a very large hiatal/paraesophageal hernia, containing the entirety of the stomach, the pancreas, the transverse colon, and numerous loops of small bowel (series 7, image 65). Thyroid gland, trachea, and esophagus demonstrate no significant findings. Lungs/Pleura: There is dense atelectasis and/or consolidation of almost the entirety of the left lower lobe as well as portions of the right lower lobe, compressive secondary to large hernia. There are scattered areas of ground-glass and heterogeneous airspace opacity throughout the remainder of the lungs. No pleural effusion or pneumothorax. Upper Abdomen: No acute abnormality. Musculoskeletal: No chest wall abnormality. No acute or significant osseous findings. Review of the MIP images confirms the above findings. IMPRESSION: 1. Negative examination for pulmonary embolism.  2. There is a very large hiatal/paraesophageal hernia, containing the entirety of the stomach, the pancreas, the transverse colon, and numerous loops of small bowel. No evidence of bowel obstruction or other acute complication. 3. There is dense atelectasis and/or consolidation of almost the entirety of the left lower lobe as well as portions of the right lower lobe, compressive secondary to large hernia. 4. There are scattered areas of acute ground-glass and heterogeneous airspace opacity throughout the remainder of the lungs, in keeping with COVID airspace disease. 5. Coronary artery disease. Aortic Atherosclerosis (ICD10-I70.0). Electronically Signed   By: Lauralyn Primes M.D.    On: 11/05/2020 11:36   DG Chest Portable 1 View  Result Date: 11/04/2020 CLINICAL DATA:  74 year old male with positive COVID-19. Hypoxia. EXAM: PORTABLE CHEST 1 VIEW COMPARISON:  None. FINDINGS: Large area of opacity over lower lungs primarily left lung base likely combination of large hiatal hernia and associated atelectasis. Pneumonia is not excluded. No pleural effusion pneumothorax. Atherosclerotic calcification of the aorta. No acute osseous pathology. IMPRESSION: Large hiatal hernia and associated atelectasis. Pneumonia is not excluded. Electronically Signed   By: Elgie Collard M.D.   On: 11/04/2020 18:30     Subjective: Patient seen and examined at bedside, resting comfortably.  Continues on supplemental oxygen, 3 L nasal cannula at rest and requires 5 L nasal cannula with exertion.  No other questions or concerns at this time.  Ready for discharge home.  Denies headache, no visual changes, no chest pain, no palpitations, no abdominal pain, no weakness, no fatigue, no paresthesias.  No acute events overnight per nursing staff.  Discharge Exam: Vitals:   11/09/20 0800 11/09/20 0808  BP: (!) 147/97   Pulse:    Resp: 18   Temp: 98 F (36.7 C)   SpO2:  90%   Vitals:   11/08/20 2359 11/09/20 0534 11/09/20 0800 11/09/20 0808  BP: 120/76 106/85 (!) 147/97   Pulse: 64 69    Resp: 20 20 18    Temp: (!) 97.5 F (36.4 C) 97.9 F (36.6 C) 98 F (36.7 C)   TempSrc: Oral Oral    SpO2: 92% 93%  90%  Weight:      Height:        General: Pt is alert, awake, not in acute distress Cardiovascular: RRR, S1/S2 +, no rubs, no gallops Respiratory: CTA bilaterally, no wheezing, no rhonchi, on 3 L nasal cannula at rest; requires 5 L nasal cannula with ambulation Abdominal: Soft, NT, ND, bowel sounds + Extremities: no edema, no cyanosis    The results of significant diagnostics from this hospitalization (including imaging, microbiology, ancillary and laboratory) are listed below for  reference.     Microbiology: No results found for this or any previous visit (from the past 240 hour(s)).   Labs: BNP (last 3 results) No results for input(s): BNP in the last 8760 hours. Basic Metabolic Panel: Recent Labs  Lab 11/05/20 0427 11/06/20 0421 11/07/20 0409 11/08/20 0455 11/09/20 0406  NA 142 139 140 143 139  K 4.4 4.2 4.4 4.1 4.2  CL 100 101 104 105 103  CO2 30 29 29 30 27   GLUCOSE 177* 196* 150* 156* 165*  BUN 23 28* 26* 24* 23  CREATININE 1.56* 1.18 0.99 0.91 0.81  CALCIUM 8.7* 8.1* 8.2* 8.5* 8.2*   Liver Function Tests: Recent Labs  Lab 11/05/20 0427 11/06/20 0421 11/07/20 0409 11/08/20 0455 11/09/20 0406  AST 82* 62* 58* 70* 67*  ALT 42 36 33 40 44  ALKPHOS 55 50 50 57 55  BILITOT 1.1 0.9 1.0 1.1 1.3*  PROT 7.1 6.5 6.2* 6.5 6.4*  ALBUMIN 3.3* 2.9* 2.8* 3.0* 3.0*   No results for input(s): LIPASE, AMYLASE in the last 168 hours. No results for input(s): AMMONIA in the last 168 hours. CBC: Recent Labs  Lab 11/05/20 0427 11/06/20 0421 11/07/20 0409 11/08/20 0455 11/09/20 0406  WBC 2.9* 4.3 4.5 4.1 4.5  NEUTROABS 2.0 3.1 3.2 3.0 3.5  HGB 16.8 16.0 15.4 15.9 15.5  HCT 51.8 48.0 47.6 47.9 46.7  MCV 94.2 93.8 95.0 94.1 93.2  PLT 113* 127* 144* 155 144*   Cardiac Enzymes: No results for input(s): CKTOTAL, CKMB, CKMBINDEX, TROPONINI in the last 168 hours. BNP: Invalid input(s): POCBNP CBG: Recent Labs  Lab 11/05/20 0848 11/05/20 1135 11/05/20 1703 11/05/20 1954  GLUCAP 134* 119* 112* 130*   D-Dimer No results for input(s): DDIMER in the last 72 hours. Hgb A1c No results for input(s): HGBA1C in the last 72 hours. Lipid Profile No results for input(s): CHOL, HDL, LDLCALC, TRIG, CHOLHDL, LDLDIRECT in the last 72 hours. Thyroid function studies No results for input(s): TSH, T4TOTAL, T3FREE, THYROIDAB in the last 72 hours.  Invalid input(s): FREET3 Anemia work up No results for input(s): VITAMINB12, FOLATE, FERRITIN, TIBC, IRON,  RETICCTPCT in the last 72 hours. Urinalysis No results found for: COLORURINE, APPEARANCEUR, LABSPEC, PHURINE, GLUCOSEU, HGBUR, BILIRUBINUR, KETONESUR, PROTEINUR, UROBILINOGEN, NITRITE, LEUKOCYTESUR Sepsis Labs Invalid input(s): PROCALCITONIN,  WBC,  LACTICIDVEN Microbiology No results found for this or any previous visit (from the past 240 hour(s)).   Time coordinating discharge: Over 30 minutes  SIGNED:   Alvira Philips [place], DO  [facility] 11/09/2020, 11:11 AM

## 2020-11-09 NOTE — Discharge Instructions (Signed)
10 Things You Can Do to Manage Your COVID-19 Symptoms at Home If you have possible or confirmed COVID-19: 1. Stay home from work and school. And stay away from other public places. If you must go out, avoid using any kind of public transportation, ridesharing, or taxis. 2. Monitor your symptoms carefully. If your symptoms get worse, call your healthcare provider immediately. 3. Get rest and stay hydrated. 4. If you have a medical appointment, call the healthcare provider ahead of time and tell them that you have or may have COVID-19. 5. For medical emergencies, call 911 and notify the dispatch personnel that you have or may have COVID-19. 6. Cover your cough and sneezes with a tissue or use the inside of your elbow. 7. Wash your hands often with soap and water for at least 20 seconds or clean your hands with an alcohol-based hand sanitizer that contains at least 60% alcohol. 8. As much as possible, stay in a specific room and away from other people in your home. Also, you should use a separate bathroom, if available. If you need to be around other people in or outside of the home, wear a mask. 9. Avoid sharing personal items with other people in your household, like dishes, towels, and bedding. 10. Clean all surfaces that are touched often, like counters, tabletops, and doorknobs. Use household cleaning sprays or wipes according to the label instructions. cdc.gov/coronavirus 05/13/2019 This information is not intended to replace advice given to you by your health care provider. Make sure you discuss any questions you have with your health care provider. Document Revised: 10/15/2019 Document Reviewed: 10/15/2019 Elsevier Patient Education  2020 [employer]   COVID-19 COVID-19 is a respiratory infection that is caused by a virus called severe acute respiratory syndrome coronavirus 2 (SARS-CoV-2). The disease is also known as coronavirus disease or novel coronavirus. In some people, the virus may  not cause any symptoms. In others, it may cause a serious infection. The infection can get worse quickly and can lead to complications, such as:  Pneumonia, or infection of the lungs.  Acute respiratory distress syndrome or ARDS. This is a condition in which fluid build-up in the lungs prevents the lungs from filling with air and passing oxygen into the blood.  Acute respiratory failure. This is a condition in which there is not enough oxygen passing from the lungs to the body or when carbon dioxide is not passing from the lungs out of the body.  Sepsis or septic shock. This is a serious bodily reaction to an infection.  Blood clotting problems.  Secondary infections due to bacteria or fungus.  Organ failure. This is when your body's organs stop working. The virus that causes COVID-19 is contagious. This means that it can spread from person to person through droplets from coughs and sneezes (respiratory secretions). What are the causes? This illness is caused by a virus. You may catch the virus by:  Breathing in droplets from an infected person. Droplets can be spread by a person breathing, speaking, singing, coughing, or sneezing.  Touching something, like a table or a doorknob, that was exposed to the virus (contaminated) and then touching your mouth, nose, or eyes. What increases the risk? Risk for infection You are more likely to be infected with this virus if you:  Are within 6 feet (2 meters) of a person with COVID-19.  Provide care for or live with a person who is infected with COVID-19.  Spend time in crowded indoor spaces or   live in shared housing. Risk for serious illness You are more likely to become seriously ill from the virus if you:  Are 50 years of age or older. The higher your age, the more you are at risk for serious illness.  Live in a nursing home or long-term care facility.  Have cancer.  Have a long-term (chronic) disease such as: ? Chronic lung disease,  including chronic obstructive pulmonary disease or asthma. ? A long-term disease that lowers your body's ability to fight infection (immunocompromised). ? Heart disease, including heart failure, a condition in which the arteries that lead to the heart become narrow or blocked (coronary artery disease), a disease which makes the heart muscle thick, weak, or stiff (cardiomyopathy). ? Diabetes. ? Chronic kidney disease. ? Sickle cell disease, a condition in which red blood cells have an abnormal "sickle" shape. ? Liver disease.  Are obese. What are the signs or symptoms? Symptoms of this condition can range from mild to severe. Symptoms may appear any time from 2 to 14 days after being exposed to the virus. They include:  A fever or chills.  A cough.  Difficulty breathing.  Headaches, body aches, or muscle aches.  Runny or stuffy (congested) nose.  A sore throat.  New loss of taste or smell. Some people may also have stomach problems, such as nausea, vomiting, or diarrhea. Other people may not have any symptoms of COVID-19. How is this diagnosed? This condition may be diagnosed based on:  Your signs and symptoms, especially if: ? You live in an area with a COVID-19 outbreak. ? You recently traveled to or from an area where the virus is common. ? You provide care for or live with a person who was diagnosed with COVID-19. ? You were exposed to a person who was diagnosed with COVID-19.  A physical exam.  Lab tests, which may include: ? Taking a sample of fluid from the back of your nose and throat (nasopharyngeal fluid), your nose, or your throat using a swab. ? A sample of mucus from your lungs (sputum). ? Blood tests.  Imaging tests, which may include, X-rays, CT scan, or ultrasound. How is this treated? At present, there is no medicine to treat COVID-19. Medicines that treat other diseases are being used on a trial basis to see if they are effective against COVID-19. Your  health care provider will talk with you about ways to treat your symptoms. For most people, the infection is mild and can be managed at home with rest, fluids, and over-the-counter medicines. Treatment for a serious infection usually takes places in a hospital intensive care unit (ICU). It may include one or more of the following treatments. These treatments are given until your symptoms improve.  Receiving fluids and medicines through an IV.  Supplemental oxygen. Extra oxygen is given through a tube in the nose, a face mask, or a hood.  Positioning you to lie on your stomach (prone position). This makes it easier for oxygen to get into the lungs.  Continuous positive airway pressure (CPAP) or bi-level positive airway pressure (BPAP) machine. This treatment uses mild air pressure to keep the airways open. A tube that is connected to a motor delivers oxygen to the body.  Ventilator. This treatment moves air into and out of the lungs by using a tube that is placed in your windpipe.  Tracheostomy. This is a procedure to create a hole in the neck so that a breathing tube can be inserted.  Extracorporeal membrane   oxygenation (ECMO). This procedure gives the lungs a chance to recover by taking over the functions of the heart and lungs. It supplies oxygen to the body and removes carbon dioxide. Follow these instructions at home: Lifestyle  If you are sick, stay home except to get medical care. Your health care provider will tell you how long to stay home. Call your health care provider before you go for medical care.  Rest at home as told by your health care provider.  Do not use any products that contain nicotine or tobacco, such as cigarettes, e-cigarettes, and chewing tobacco. If you need help quitting, ask your health care provider.  Return to your normal activities as told by your health care provider. Ask your health care provider what activities are safe for you. General  instructions  Take over-the-counter and prescription medicines only as told by your health care provider.  Drink enough fluid to keep your urine pale yellow.  Keep all follow-up visits as told by your health care provider. This is important. How is this prevented?  There is no vaccine to help prevent COVID-19 infection. However, there are steps you can take to protect yourself and others from this virus. To protect yourself:   Do not travel to areas where COVID-19 is a risk. The areas where COVID-19 is reported change often. To identify high-risk areas and travel restrictions, check the CDC travel website: wwwnc.cdc.gov/travel/notices  If you live in, or must travel to, an area where COVID-19 is a risk, take precautions to avoid infection. ? Stay away from people who are sick. ? Wash your hands often with soap and water for 20 seconds. If soap and water are not available, use an alcohol-based hand sanitizer. ? Avoid touching your mouth, face, eyes, or nose. ? Avoid going out in public, follow guidance from your state and local health authorities. ? If you must go out in public, wear a cloth face covering or face mask. Make sure your mask covers your nose and mouth. ? Avoid crowded indoor spaces. Stay at least 6 feet (2 meters) away from others. ? Disinfect objects and surfaces that are frequently touched every day. This may include:  Counters and tables.  Doorknobs and light switches.  Sinks and faucets.  Electronics, such as phones, remote controls, keyboards, computers, and tablets. To protect others: If you have symptoms of COVID-19, take steps to prevent the virus from spreading to others.  If you think you have a COVID-19 infection, contact your health care provider right away. Tell your health care team that you think you may have a COVID-19 infection.  Stay home. Leave your house only to seek medical care. Do not use public transport.  Do not travel while you are  sick.  Wash your hands often with soap and water for 20 seconds. If soap and water are not available, use alcohol-based hand sanitizer.  Stay away from other members of your household. Let healthy household members care for children and pets, if possible. If you have to care for children or pets, wash your hands often and wear a mask. If possible, stay in your own room, separate from others. Use a different bathroom.  Make sure that all people in your household wash their hands well and often.  Cough or sneeze into a tissue or your sleeve or elbow. Do not cough or sneeze into your hand or into the air.  Wear a cloth face covering or face mask. Make sure your mask covers your nose   and mouth. Where to find more information  Centers for Disease Control and Prevention: PurpleGadgets.be  World Health Organization: https://www.castaneda.info/ Contact a health care provider if:  You live in or have traveled to an area where COVID-19 is a risk and you have symptoms of the infection.  You have had contact with someone who has COVID-19 and you have symptoms of the infection. Get help right away if:  You have trouble breathing.  You have pain or pressure in your chest.  You have confusion.  You have bluish lips and fingernails.  You have difficulty waking from sleep.  You have symptoms that get worse. These symptoms may represent a serious problem that is an emergency. Do not wait to see if the symptoms will go away. Get medical help right away. Call your local emergency services (911 in the [place] Do not drive yourself to the hospital. Let the emergency medical personnel know if you think you have COVID-19. Summary  COVID-19 is a respiratory infection that is caused by a virus. It is also known as coronavirus disease or novel coronavirus. It can cause serious infections, such as pneumonia, acute respiratory distress syndrome, acute respiratory failure,  or sepsis.  The virus that causes COVID-19 is contagious. This means that it can spread from person to person through droplets from breathing, speaking, singing, coughing, or sneezing.  You are more likely to develop a serious illness if you are 56 years of age or older, have a weak immune system, live in a nursing home, or have chronic disease.  There is no medicine to treat COVID-19. Your health care provider will talk with you about ways to treat your symptoms.  Take steps to protect yourself and others from infection. Wash your hands often and disinfect objects and surfaces that are frequently touched every day. Stay away from people who are sick and wear a mask if you are sick. This information is not intended to replace advice given to you by your health care provider. Make sure you discuss any questions you have with your health care provider. Document Revised: 08/28/2019 Document Reviewed: 12/04/2018 Elsevier Patient Education  2020 [place].  COVID-19: How to Protect Yourself and Others Know how it spreads  There is currently no vaccine to prevent coronavirus disease 2019 (COVID-19).  The best way to prevent illness is to avoid being exposed to this virus.  The virus is thought to spread mainly from person-to-person. ? Between people who are in close contact with one another (within about 6 feet). ? Through respiratory droplets produced when an infected person coughs, sneezes or talks. ? These droplets can land in the mouths or noses of people who are nearby or possibly be inhaled into the lungs. ? COVID-19 may be spread by people who are not showing symptoms. Everyone should Clean your hands often  Wash your hands often with soap and water for at least 20 seconds especially after you have been in a public place, or after blowing your nose, coughing, or sneezing.  If soap and water are not readily available, use a hand sanitizer that contains at least 60% alcohol. Cover  all surfaces of your hands and rub them together until they feel dry.  Avoid touching your eyes, nose, and mouth with unwashed hands. Avoid close contact  Limit contact with others as much as possible.  Avoid close contact with people who are sick.  Put distance between yourself and other people. ? Remember that some people without symptoms may be  able to spread virus. ? This is especially important for people who are at higher risk of getting very [employer].it Cover your mouth and nose with a mask when around others  You could spread COVID-19 to others even if you do not feel sick.  Everyone should wear a mask in public settings and when around people not living in their household, especially when social distancing is difficult to maintain. ? Masks should not be placed on young children under age 31, anyone who has trouble breathing, or is unconscious, incapacitated or otherwise unable to remove the mask without assistance.  The mask is meant to protect other people in case you are infected.  Do NOT use a facemask meant for a Research scientist (physical sciences).  Continue to keep about 6 feet between yourself and others. The mask is not a substitute for social distancing. Cover coughs and sneezes  Always cover your mouth and nose with a tissue when you cough or sneeze or use the inside of your elbow.  Throw used tissues in the trash.  Immediately wash your hands with soap and water for at least 20 seconds. If soap and water are not readily available, clean your hands with a hand sanitizer that contains at least 60% alcohol. Clean and disinfect  Clean AND disinfect frequently touched surfaces daily. This includes tables, doorknobs, light switches, countertops, handles, desks, phones, keyboards, toilets, faucets, and sinks. ktimeonline.com  If surfaces are  dirty, clean them: Use detergent or soap and water prior to disinfection.  Then, use a household disinfectant. You can see a list of EPA-registered household disinfectants here. [place]Flowers.co.uk 07/15/2019 This information is not intended to replace advice given to you by your health care provider. Make sure you discuss any questions you have with your health care provider. Document Revised: 07/23/2019 Document Reviewed: 05/21/2019 Elsevier Patient Education  2020 Elsevier Inc.    Person Under Monitoring Name: Johnny Reed  Location: [address] [place] [address]   Infection Prevention Recommendations for Individuals Confirmed to have, or Being Evaluated for, 2019 Novel Coronavirus (COVID-19) Infection Who Receive Care at Home  Individuals who are confirmed to have, or are being evaluated for, COVID-19 should follow the prevention steps below until a healthcare provider or local or state health department says they can return to normal activities.  Stay home except to get medical care You should restrict activities outside your home, except for getting medical care. Do not go to work, school, or public areas, and do not use public transportation or taxis.  Call ahead before visiting your doctor Before your medical appointment, call the healthcare provider and tell them that you have, or are being evaluated for, COVID-19 infection. This will help the healthcare provider's office take steps to keep other people from getting infected. Ask your healthcare provider to call the local or state health department.  Monitor your symptoms Seek prompt medical attention if your illness is worsening (e.g., difficulty breathing). Before going to your medical appointment, call the healthcare provider and tell them that you have, or are being evaluated for, COVID-19 infection. Ask your healthcare provider to call the local or state health department.  Wear a facemask You should  wear a facemask that covers your nose and mouth when you are in the same room with other people and when you visit a healthcare provider. People who live with or visit you should also wear a facemask while they are in the same room with you.  Separate yourself from other people  in your home As much as possible, you should stay in a different room from other people in your home. Also, you should use a separate bathroom, if available.  Avoid sharing household items You should not share dishes, drinking glasses, cups, eating utensils, towels, bedding, or other items with other people in your home. After using these items, you should wash them thoroughly with soap and water.  Cover your coughs and sneezes Cover your mouth and nose with a tissue when you cough or sneeze, or you can cough or sneeze into your sleeve. Throw used tissues in a lined trash can, and immediately wash your hands with soap and water for at least 20 seconds or use an alcohol-based hand rub.  Wash your [employer] your hands often and thoroughly with soap and water for at least 20 seconds. You can use an alcohol-based hand sanitizer if soap and water are not available and if your hands are not visibly dirty. Avoid touching your eyes, nose, and mouth with unwashed hands.   Prevention Steps for Caregivers and Household Members of Individuals Confirmed to have, or Being Evaluated for, COVID-19 Infection Being Cared for in the Home  If you live with, or provide care at home for, a person confirmed to have, or being evaluated for, COVID-19 infection please follow these guidelines to prevent infection:  Follow healthcare provider's instructions Make sure that you understand and can help the patient follow any healthcare provider instructions for all care.  Provide for the patient's basic needs You should help the patient with basic needs in the home and provide support for getting groceries, prescriptions, and other  personal needs.  Monitor the patient's symptoms If they are getting sicker, call his or her medical provider and tell them that the patient has, or is being evaluated for, COVID-19 infection. This will help the healthcare provider's office take steps to keep other people from getting infected. Ask the healthcare provider to call the local or state health department.  Limit the number of people who have contact with the patient  If possible, have only one caregiver for the patient.  Other household members should stay in another home or place of residence. If this is not possible, they should stay  in another room, or be separated from the patient as much as possible. Use a separate bathroom, if available.  Restrict visitors who do not have an essential need to be in the home.  Keep older adults, very young children, and other sick people away from the patient Keep older adults, very young children, and those who have compromised immune systems or chronic health conditions away from the patient. This includes people with chronic heart, lung, or kidney conditions, diabetes, and cancer.  Ensure good ventilation Make sure that shared spaces in the home have good air flow, such as from an air conditioner or an opened window, weather permitting.  Wash your hands often  Wash your hands often and thoroughly with soap and water for at least 20 seconds. You can use an alcohol based hand sanitizer if soap and water are not available and if your hands are not visibly dirty.  Avoid touching your eyes, nose, and mouth with unwashed hands.  Use disposable paper towels to dry your hands. If not available, use dedicated cloth towels and replace them when they become wet.  Wear a facemask and gloves  Wear a disposable facemask at all times in the room and gloves when you touch or have contact with  the patient's blood, body fluids, and/or secretions or excretions, such as sweat, saliva, sputum, nasal  mucus, vomit, urine, or feces.  Ensure the mask fits over your nose and mouth tightly, and do not touch it during use.  Throw out disposable facemasks and gloves after using them. Do not reuse.  Wash your hands immediately after removing your facemask and gloves.  If your personal clothing becomes contaminated, carefully remove clothing and launder. Wash your hands after handling contaminated clothing.  Place all used disposable facemasks, gloves, and other waste in a lined container before disposing them with other household waste.  Remove gloves and wash your hands immediately after handling these items.  Do not share dishes, glasses, or other household items with the patient  Avoid sharing household items. You should not share dishes, drinking glasses, cups, eating utensils, towels, bedding, or other items with a patient who is confirmed to have, or being evaluated for, COVID-19 infection.  After the person uses these items, you should wash them thoroughly with soap and water.  Wash laundry thoroughly  Immediately remove and wash clothes or bedding that have blood, body fluids, and/or secretions or excretions, such as sweat, saliva, sputum, nasal mucus, vomit, urine, or feces, on them.  Wear gloves when handling laundry from the patient.  Read and follow directions on labels of laundry or clothing items and detergent. In general, wash and dry with the warmest temperatures recommended on the label.  Clean all areas the individual has used often  Clean all touchable surfaces, such as counters, tabletops, doorknobs, bathroom fixtures, toilets, phones, keyboards, tablets, and bedside tables, every day. Also, clean any surfaces that may have blood, body fluids, and/or secretions or excretions on them.  Wear gloves when cleaning surfaces the patient has come in contact with.  Use a diluted bleach solution (e.g., dilute bleach with 1 part bleach and 10 parts water) or a household  disinfectant with a label that says EPA-registered for coronaviruses. To make a bleach solution at home, add 1 tablespoon of bleach to 1 quart (4 cups) of water. For a larger supply, add  cup of bleach to 1 gallon (16 cups) of water.  Read labels of cleaning products and follow recommendations provided on product labels. Labels contain instructions for safe and effective use of the cleaning product including precautions you should take when applying the product, such as wearing gloves or eye protection and making sure you have good ventilation during use of the product.  Remove gloves and wash hands immediately after cleaning.  Monitor yourself for signs and symptoms of illness Caregivers and household members are considered close contacts, should monitor their health, and will be asked to limit movement outside of the home to the extent possible. Follow the monitoring steps for close contacts listed on the symptom monitoring form.   ? If you have additional questions, contact your local health department or call the epidemiologist on call at [phone] (available 24/7). ? This guidance is subject to change. For the most up-to-date guidance from [facility], please refer to their website: [email or web address]

## 2021-04-22 IMAGING — CT CT ANGIO CHEST
2 of 6 series · 18 of 46 positions shown · IV contrast (APPLIED)
Comparison: None.

CLINICAL DATA: PE suspected, COVID positive

EXAM:
CT ANGIOGRAPHY CHEST WITH CONTRAST
TECHNIQUE: Multidetector CT imaging of the chest was performed using the
standard protocol during bolus administration of intravenous
contrast. Multiplanar CT image reconstructions and MIPs were
obtained to evaluate the vascular anatomy.
CONTRAST:  100 mL Omnipaque 350 iodinated contrast IV

[Series 5: thins · axial · 0.76mm/px · z∈[-564,-249]mm · 16 of 346 slices shown]
[im 16/346  lung]
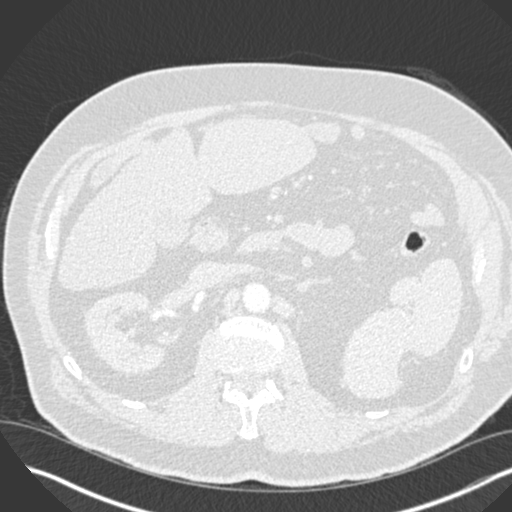
[im 46/346  soft-tissue]
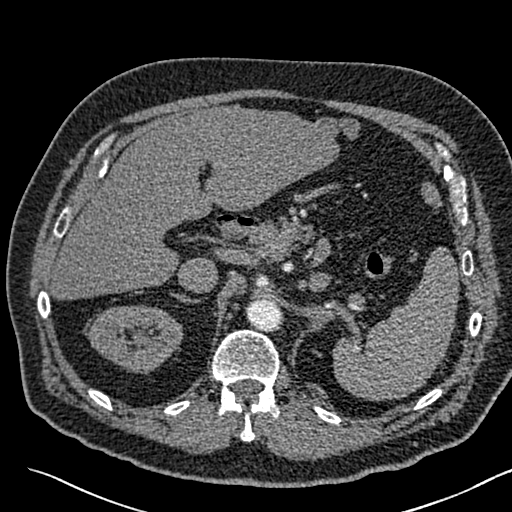
[im 61/346  lung]
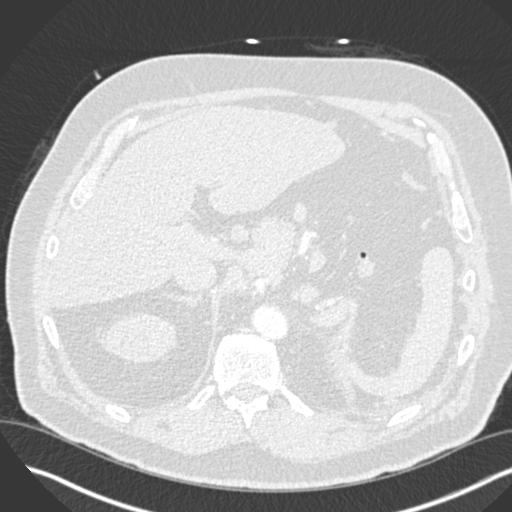
[im 76/346  soft-tissue]
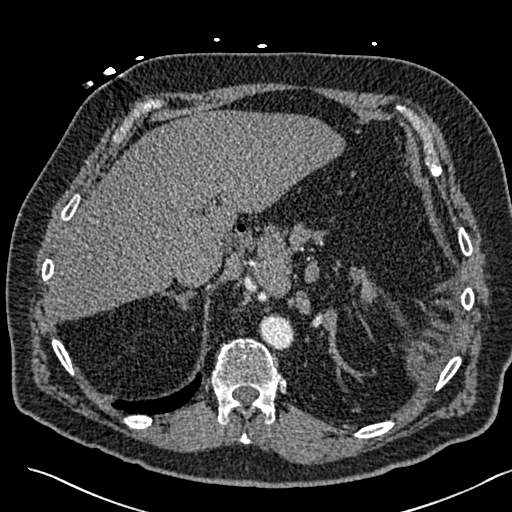
[im 106/346  lung]
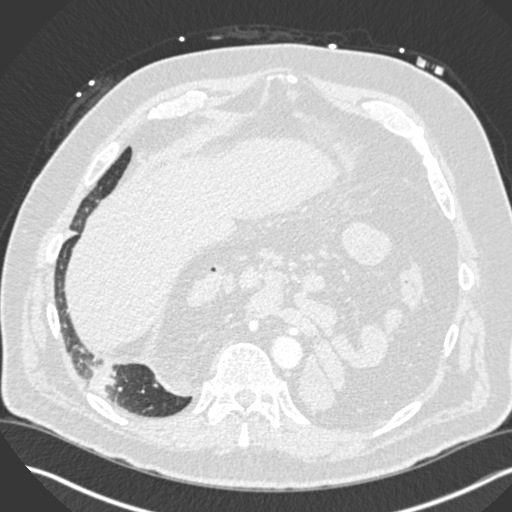
[im 121/346  soft-tissue]
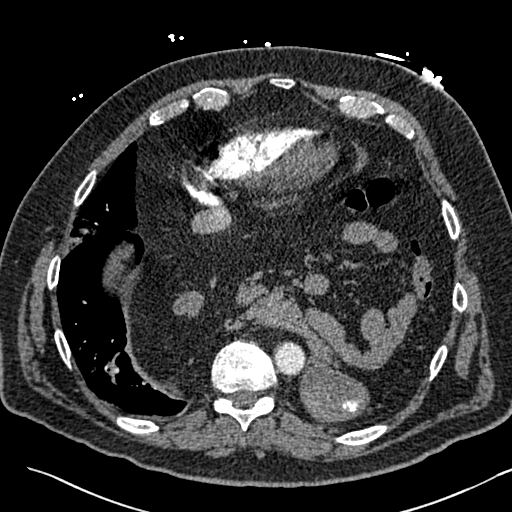
[im 136/346  lung]
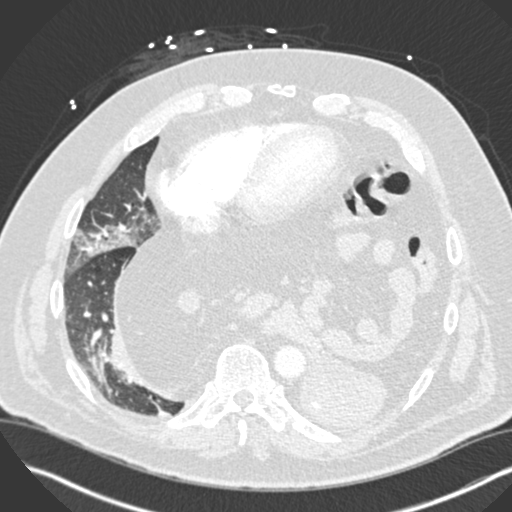
[im 166/346  soft-tissue]
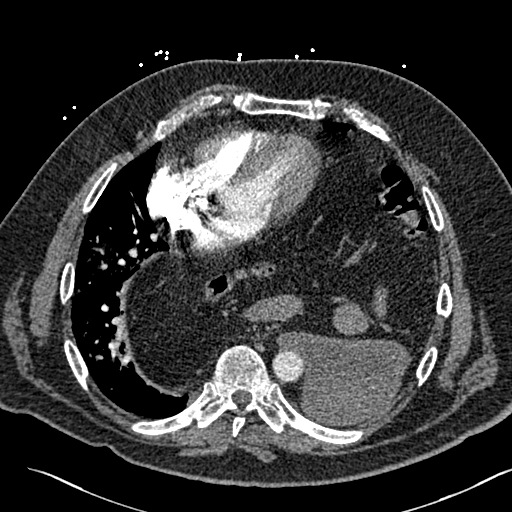
[im 181/346  lung]
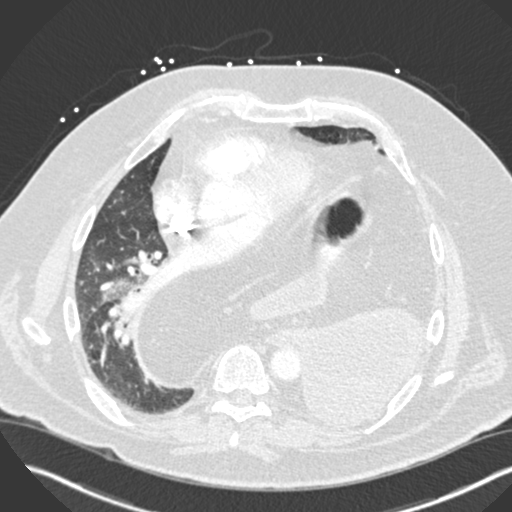
[im 211/346  soft-tissue]
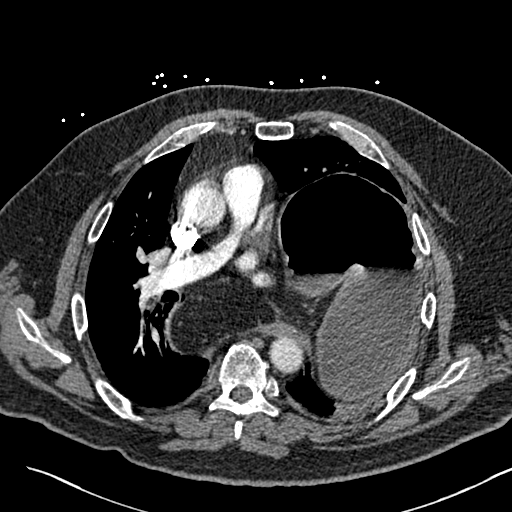
[im 226/346  lung]
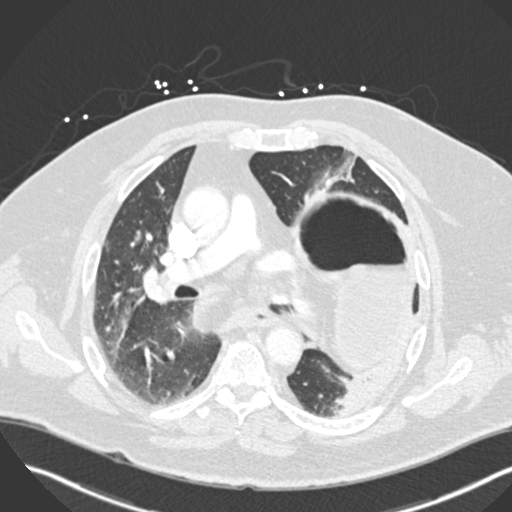
[im 241/346  soft-tissue]
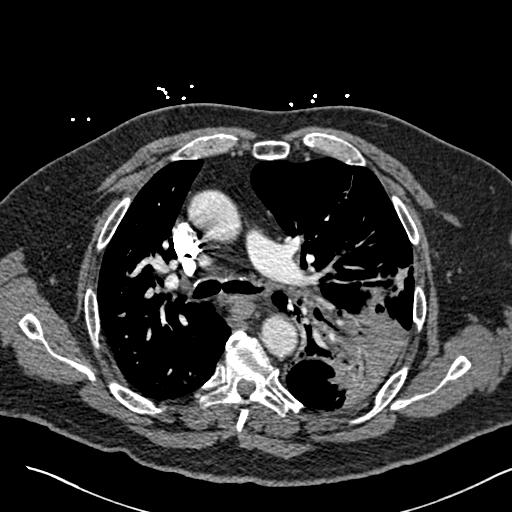
[im 271/346  lung]
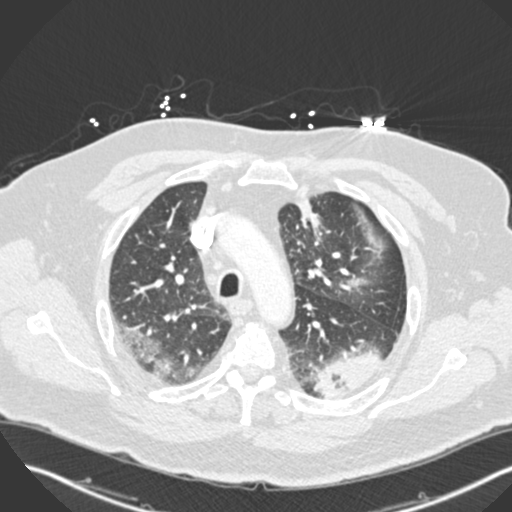
[im 286/346  soft-tissue]
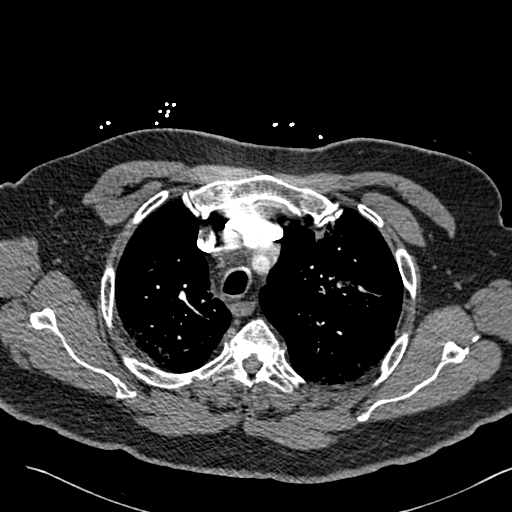
[im 301/346  lung]
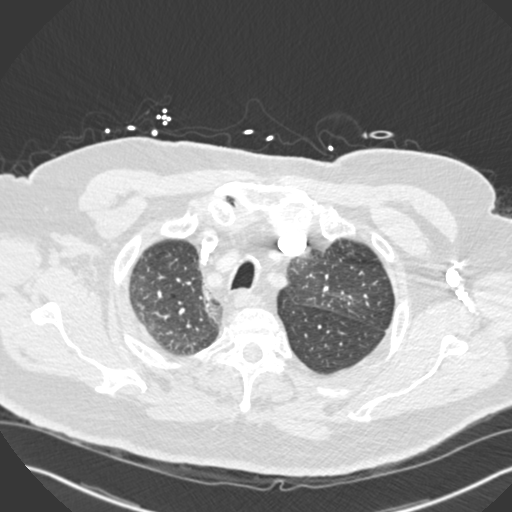
[im 331/346  soft-tissue]
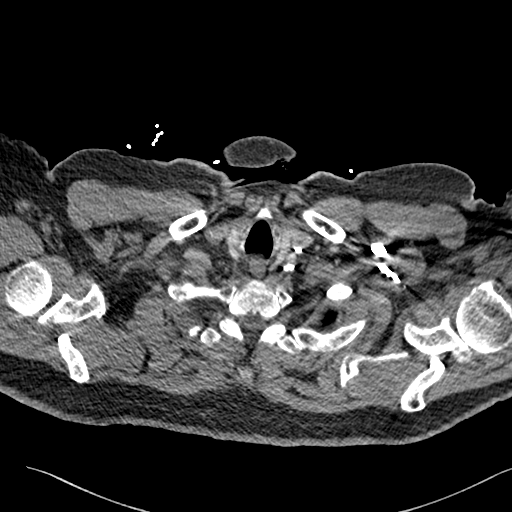

[Series 7: coronal mpr · coronal · 0.68mm/px · 2 of 109 slices shown]
[im 37/109  soft-tissue]
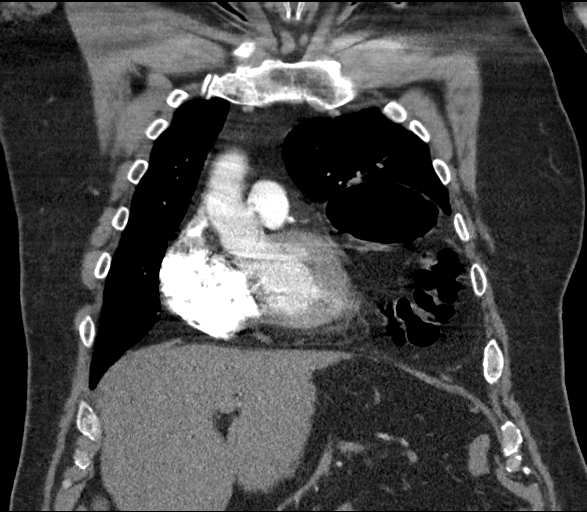
[im 73/109  soft-tissue]
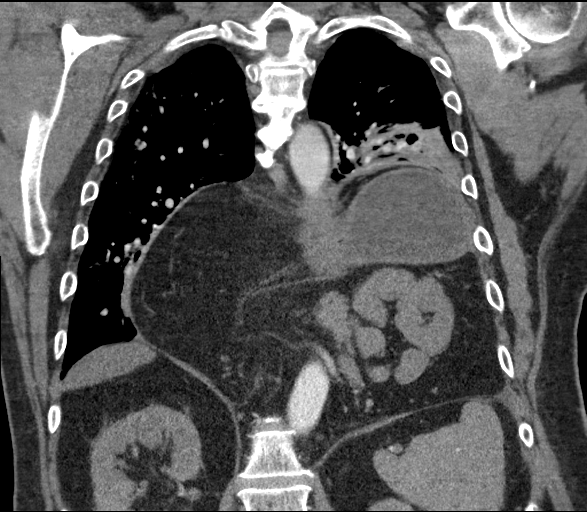

[18 of 46 positions shown; findings below may reference images not displayed]

FINDINGS: Cardiovascular: Satisfactory opacification of the pulmonary arteries
to the segmental level. No evidence of pulmonary embolism. Normal
heart size. No pericardial effusion. Aortic atherosclerosis.
Scattered coronary artery calcifications.

Mediastinum/Nodes: No enlarged mediastinal, hilar, or axillary lymph
nodes. There is a very large hiatal/paraesophageal hernia,
containing the entirety of the stomach, the pancreas, the transverse
colon, and numerous loops of small bowel (series 7, image 65).
Thyroid gland, trachea, and esophagus demonstrate no significant
findings.

Lungs/Pleura: There is dense atelectasis and/or consolidation of
almost the entirety of the left lower lobe as well as portions of
the right lower lobe, compressive secondary to large hernia. There
are scattered areas of ground-glass and heterogeneous airspace
opacity throughout the remainder of the lungs. No pleural effusion
or pneumothorax.

Upper Abdomen: No acute abnormality.

Musculoskeletal: No chest wall abnormality. No acute or significant
osseous findings.

Review of the MIP images confirms the above findings.
IMPRESSION: 1. Negative examination for pulmonary embolism.
2. There is a very large hiatal/paraesophageal hernia, containing
the entirety of the stomach, the pancreas, the transverse colon, and
numerous loops of small bowel. No evidence of bowel obstruction or
other acute complication.
3. There is dense atelectasis and/or consolidation of almost the
entirety of the left lower lobe as well as portions of the right
lower lobe, compressive secondary to large hernia.
4. There are scattered areas of acute ground-glass and heterogeneous
airspace opacity throughout the remainder of the lungs, in keeping
with COVID airspace disease.
5. Coronary artery disease.

Aortic Atherosclerosis ([id number]).
